# Patient Record
Sex: Male | Born: 1960 | Race: White | Hispanic: No | Marital: Married | Smoking: Current every day smoker
Health system: Southern US, Community
[De-identification: ages and names within clinical notes are randomized; demographics above are authoritative.]

## PROBLEM LIST (undated history)

## (undated) ENCOUNTER — Emergency Department (HOSPITAL_COMMUNITY): Admission: EM | Payer: 59

## (undated) DIAGNOSIS — G40909 Epilepsy, unspecified, not intractable, without status epilepticus: Secondary | ICD-10-CM

## (undated) DIAGNOSIS — E1140 Type 2 diabetes mellitus with diabetic neuropathy, unspecified: Secondary | ICD-10-CM

## (undated) DIAGNOSIS — N183 Chronic kidney disease, stage 3 unspecified: Secondary | ICD-10-CM

## (undated) DIAGNOSIS — L03119 Cellulitis of unspecified part of limb: Secondary | ICD-10-CM

## (undated) DIAGNOSIS — R569 Unspecified convulsions: Secondary | ICD-10-CM

## (undated) DIAGNOSIS — I10 Essential (primary) hypertension: Secondary | ICD-10-CM

## (undated) DIAGNOSIS — L02619 Cutaneous abscess of unspecified foot: Secondary | ICD-10-CM

## (undated) HISTORY — PX: BRAIN SURGERY: SHX531

## (undated) HISTORY — PX: HERNIA REPAIR: SHX51

## (undated) HISTORY — PX: TOE AMPUTATION: SHX809

---

## 2014-03-24 ENCOUNTER — Ambulatory Visit
Admission: RE | Admit: 2014-03-24 | Discharge: 2014-03-24 | Disposition: A | Discharge status: Home / Self Care | Payer: No Typology Code available for payment source | Source: Ambulatory Visit | Attending: *Deleted | Admitting: *Deleted

## 2014-03-24 ENCOUNTER — Other Ambulatory Visit: Payer: Self-pay | Admitting: *Deleted

## 2014-03-24 DIAGNOSIS — R7611 Nonspecific reaction to tuberculin skin test without active tuberculosis: Secondary | ICD-10-CM

## 2014-04-30 ENCOUNTER — Inpatient Hospital Stay (HOSPITAL_COMMUNITY): Payer: 59

## 2014-04-30 ENCOUNTER — Emergency Department (HOSPITAL_COMMUNITY): Payer: 59

## 2014-04-30 ENCOUNTER — Encounter (HOSPITAL_COMMUNITY): Payer: Self-pay | Admitting: Emergency Medicine

## 2014-04-30 ENCOUNTER — Inpatient Hospital Stay (HOSPITAL_COMMUNITY)
Admission: EM | Admit: 2014-04-30 | Discharge: 2014-05-02 | DRG: 572 | Disposition: A | Discharge status: Home / Self Care | Payer: 59 | Attending: Internal Medicine | Admitting: Internal Medicine

## 2014-04-30 DIAGNOSIS — I10 Essential (primary) hypertension: Secondary | ICD-10-CM

## 2014-04-30 DIAGNOSIS — I739 Peripheral vascular disease, unspecified: Secondary | ICD-10-CM | POA: Diagnosis present

## 2014-04-30 DIAGNOSIS — E1159 Type 2 diabetes mellitus with other circulatory complications: Secondary | ICD-10-CM | POA: Diagnosis present

## 2014-04-30 DIAGNOSIS — L97519 Non-pressure chronic ulcer of other part of right foot with unspecified severity: Secondary | ICD-10-CM

## 2014-04-30 DIAGNOSIS — E1329 Other specified diabetes mellitus with other diabetic kidney complication: Secondary | ICD-10-CM

## 2014-04-30 DIAGNOSIS — N189 Chronic kidney disease, unspecified: Secondary | ICD-10-CM

## 2014-04-30 DIAGNOSIS — Z794 Long term (current) use of insulin: Secondary | ICD-10-CM | POA: Diagnosis not present

## 2014-04-30 DIAGNOSIS — I129 Hypertensive chronic kidney disease with stage 1 through stage 4 chronic kidney disease, or unspecified chronic kidney disease: Secondary | ICD-10-CM | POA: Diagnosis present

## 2014-04-30 DIAGNOSIS — I798 Other disorders of arteries, arterioles and capillaries in diseases classified elsewhere: Secondary | ICD-10-CM | POA: Diagnosis present

## 2014-04-30 DIAGNOSIS — L03115 Cellulitis of right lower limb: Secondary | ICD-10-CM

## 2014-04-30 DIAGNOSIS — F1010 Alcohol abuse, uncomplicated: Secondary | ICD-10-CM | POA: Diagnosis present

## 2014-04-30 DIAGNOSIS — E1169 Type 2 diabetes mellitus with other specified complication: Secondary | ICD-10-CM | POA: Diagnosis present

## 2014-04-30 DIAGNOSIS — N058 Unspecified nephritic syndrome with other morphologic changes: Secondary | ICD-10-CM | POA: Diagnosis present

## 2014-04-30 DIAGNOSIS — E1142 Type 2 diabetes mellitus with diabetic polyneuropathy: Secondary | ICD-10-CM | POA: Diagnosis present

## 2014-04-30 DIAGNOSIS — Z9641 Presence of insulin pump (external) (internal): Secondary | ICD-10-CM | POA: Diagnosis not present

## 2014-04-30 DIAGNOSIS — S98119A Complete traumatic amputation of unspecified great toe, initial encounter: Secondary | ICD-10-CM

## 2014-04-30 DIAGNOSIS — L089 Local infection of the skin and subcutaneous tissue, unspecified: Secondary | ICD-10-CM

## 2014-04-30 DIAGNOSIS — N183 Chronic kidney disease, stage 3 unspecified: Secondary | ICD-10-CM

## 2014-04-30 DIAGNOSIS — L97529 Non-pressure chronic ulcer of other part of left foot with unspecified severity: Secondary | ICD-10-CM

## 2014-04-30 DIAGNOSIS — E1129 Type 2 diabetes mellitus with other diabetic kidney complication: Secondary | ICD-10-CM | POA: Diagnosis present

## 2014-04-30 DIAGNOSIS — T798XXA Other early complications of trauma, initial encounter: Secondary | ICD-10-CM

## 2014-04-30 DIAGNOSIS — E11621 Type 2 diabetes mellitus with foot ulcer: Secondary | ICD-10-CM | POA: Diagnosis present

## 2014-04-30 DIAGNOSIS — E875 Hyperkalemia: Secondary | ICD-10-CM | POA: Diagnosis present

## 2014-04-30 DIAGNOSIS — E1149 Type 2 diabetes mellitus with other diabetic neurological complication: Secondary | ICD-10-CM | POA: Diagnosis present

## 2014-04-30 DIAGNOSIS — G40909 Epilepsy, unspecified, not intractable, without status epilepticus: Secondary | ICD-10-CM

## 2014-04-30 DIAGNOSIS — M7989 Other specified soft tissue disorders: Secondary | ICD-10-CM | POA: Diagnosis present

## 2014-04-30 DIAGNOSIS — L02619 Cutaneous abscess of unspecified foot: Principal | ICD-10-CM

## 2014-04-30 DIAGNOSIS — D649 Anemia, unspecified: Secondary | ICD-10-CM

## 2014-04-30 DIAGNOSIS — S98139A Complete traumatic amputation of one unspecified lesser toe, initial encounter: Secondary | ICD-10-CM | POA: Diagnosis not present

## 2014-04-30 DIAGNOSIS — L97509 Non-pressure chronic ulcer of other part of unspecified foot with unspecified severity: Secondary | ICD-10-CM | POA: Diagnosis present

## 2014-04-30 DIAGNOSIS — T148XXA Other injury of unspecified body region, initial encounter: Secondary | ICD-10-CM

## 2014-04-30 DIAGNOSIS — M795 Residual foreign body in soft tissue: Secondary | ICD-10-CM

## 2014-04-30 DIAGNOSIS — Z720 Tobacco use: Secondary | ICD-10-CM | POA: Diagnosis present

## 2014-04-30 DIAGNOSIS — L03119 Cellulitis of unspecified part of limb: Principal | ICD-10-CM

## 2014-04-30 DIAGNOSIS — E1340 Other specified diabetes mellitus with diabetic neuropathy, unspecified: Secondary | ICD-10-CM

## 2014-04-30 DIAGNOSIS — E0822 Diabetes mellitus due to underlying condition with diabetic chronic kidney disease: Secondary | ICD-10-CM

## 2014-04-30 DIAGNOSIS — E1140 Type 2 diabetes mellitus with diabetic neuropathy, unspecified: Secondary | ICD-10-CM | POA: Diagnosis present

## 2014-04-30 DIAGNOSIS — F17200 Nicotine dependence, unspecified, uncomplicated: Secondary | ICD-10-CM | POA: Diagnosis present

## 2014-04-30 HISTORY — DX: Essential (primary) hypertension: I10

## 2014-04-30 HISTORY — DX: Epilepsy, unspecified, not intractable, without status epilepticus: G40.909

## 2014-04-30 HISTORY — DX: Chronic kidney disease, stage 3 unspecified: N18.30

## 2014-04-30 HISTORY — DX: Chronic kidney disease, stage 3 (moderate): N18.3

## 2014-04-30 HISTORY — DX: Type 2 diabetes mellitus with diabetic neuropathy, unspecified: E11.40

## 2014-04-30 HISTORY — DX: Cellulitis of unspecified part of limb: L03.119

## 2014-04-30 HISTORY — DX: Cutaneous abscess of unspecified foot: L02.619

## 2014-04-30 LAB — GLUCOSE, CAPILLARY
Glucose-Capillary: 221 mg/dL — ABNORMAL HIGH (ref 70–99)
Glucose-Capillary: 361 mg/dL — ABNORMAL HIGH (ref 70–99)

## 2014-04-30 LAB — BASIC METABOLIC PANEL
Anion gap: 14 (ref 5–15)
BUN: 36 mg/dL — AB (ref 6–23)
CHLORIDE: 99 meq/L (ref 96–112)
CO2: 23 mEq/L (ref 19–32)
CREATININE: 2.03 mg/dL — AB (ref 0.50–1.35)
Calcium: 9.4 mg/dL (ref 8.4–10.5)
GFR calc Af Amer: 42 mL/min — ABNORMAL LOW (ref >90)
GFR calc non Af Amer: 36 mL/min — ABNORMAL LOW (ref >90)
GLUCOSE: 213 mg/dL — AB (ref 70–99)
Potassium: 4.9 mEq/L (ref 3.7–5.3)
Sodium: 136 mEq/L — ABNORMAL LOW (ref 137–147)

## 2014-04-30 LAB — CBC
HEMATOCRIT: 36.4 % — AB (ref 39.0–52.0)
Hemoglobin: 12.6 g/dL — ABNORMAL LOW (ref 13.0–17.0)
MCH: 31.3 pg (ref 26.0–34.0)
MCHC: 34.6 g/dL (ref 30.0–36.0)
MCV: 90.3 fL (ref 78.0–100.0)
Platelets: 207 10*3/uL (ref 150–400)
RBC: 4.03 MIL/uL — ABNORMAL LOW (ref 4.22–5.81)
RDW: 12.7 % (ref 11.5–15.5)
WBC: 10.2 10*3/uL (ref 4.0–10.5)

## 2014-04-30 MED ORDER — VANCOMYCIN HCL IN DEXTROSE 750-5 MG/150ML-% IV SOLN
750.0000 mg | Freq: Two times a day (BID) | INTRAVENOUS | Status: DC
  Administered 2014-05-01 – 2014-05-02 (×3): 750 mg via INTRAVENOUS
  Filled 2014-04-30 (×7): qty 150

## 2014-04-30 MED ORDER — ONDANSETRON HCL 4 MG/2ML IJ SOLN: 4.0000 mg | Freq: Four times a day (QID) | INTRAMUSCULAR | Status: DC | PRN

## 2014-04-30 MED ORDER — HEPARIN SODIUM (PORCINE) 5000 UNIT/ML IJ SOLN
5000.0000 [IU] | Freq: Three times a day (TID) | INTRAMUSCULAR | Status: DC
  Administered 2014-04-30: 5000 [IU] via SUBCUTANEOUS
  Filled 2014-04-30 (×3): qty 1

## 2014-04-30 MED ORDER — ONDANSETRON HCL 4 MG PO TABS: 4.0000 mg | ORAL_TABLET | Freq: Four times a day (QID) | ORAL | Status: DC | PRN

## 2014-04-30 MED ORDER — INSULIN ASPART 100 UNIT/ML ~~LOC~~ SOLN
0.0000 [IU] | Freq: Three times a day (TID) | SUBCUTANEOUS | Status: DC
  Administered 2014-04-30: 3 [IU] via SUBCUTANEOUS
  Administered 2014-05-01: 5 [IU] via SUBCUTANEOUS

## 2014-04-30 MED ORDER — MAGNESIUM OXIDE 400 (241.3 MG) MG PO TABS
400.0000 mg | ORAL_TABLET | Freq: Every day | ORAL | Status: DC
  Administered 2014-05-01 – 2014-05-02 (×2): 400 mg via ORAL
  Filled 2014-04-30 (×2): qty 1

## 2014-04-30 MED ORDER — PIPERACILLIN-TAZOBACTAM 3.375 G IVPB
3.3750 g | Freq: Three times a day (TID) | INTRAVENOUS | Status: DC
  Administered 2014-05-01 – 2014-05-02 (×5): 3.375 g via INTRAVENOUS
  Filled 2014-04-30 (×11): qty 50

## 2014-04-30 MED ORDER — VITAMIN D (ERGOCALCIFEROL) 1.25 MG (50000 UNIT) PO CAPS: 50000.0000 [IU] | ORAL_CAPSULE | ORAL | Status: DC

## 2014-04-30 MED ORDER — PIPERACILLIN-TAZOBACTAM 3.375 G IVPB
3.3750 g | Freq: Once | INTRAVENOUS | Status: AC
  Administered 2014-04-30: 3.375 g via INTRAVENOUS
  Filled 2014-04-30: qty 50

## 2014-04-30 MED ORDER — INSULIN ASPART 100 UNIT/ML ~~LOC~~ SOLN
0.0000 [IU] | Freq: Every day | SUBCUTANEOUS | Status: DC
  Administered 2014-04-30: 0 [IU] via SUBCUTANEOUS

## 2014-04-30 MED ORDER — ADULT MULTIVITAMIN W/MINERALS CH
1.0000 | ORAL_TABLET | Freq: Every day | ORAL | Status: DC
  Administered 2014-05-01 – 2014-05-02 (×2): 1 via ORAL
  Filled 2014-04-30 (×2): qty 1

## 2014-04-30 MED ORDER — ASPIRIN EC 81 MG PO TBEC
81.0000 mg | DELAYED_RELEASE_TABLET | Freq: Every day | ORAL | Status: DC
  Administered 2014-05-01 – 2014-05-02 (×2): 81 mg via ORAL
  Filled 2014-04-30 (×2): qty 1

## 2014-04-30 MED ORDER — MAGNESIUM OXIDE 400 MG PO TABS
400.0000 mg | ORAL_TABLET | Freq: Every day | ORAL | Status: DC
  Filled 2014-04-30: qty 1

## 2014-04-30 MED ORDER — SODIUM CHLORIDE 0.9 % IJ SOLN
3.0000 mL | Freq: Two times a day (BID) | INTRAMUSCULAR | Status: DC
  Administered 2014-05-01 – 2014-05-02 (×2): 3 mL via INTRAVENOUS

## 2014-04-30 MED ORDER — OMEPRAZOLE MAGNESIUM 20 MG PO TBEC: 20.0000 mg | DELAYED_RELEASE_TABLET | Freq: Every day | ORAL | Status: DC

## 2014-04-30 MED ORDER — VANCOMYCIN HCL IN DEXTROSE 1-5 GM/200ML-% IV SOLN
1000.0000 mg | Freq: Once | INTRAVENOUS | Status: AC
  Administered 2014-04-30: 1000 mg via INTRAVENOUS
  Filled 2014-04-30: qty 200

## 2014-04-30 MED ORDER — PANTOPRAZOLE SODIUM 40 MG PO TBEC
40.0000 mg | DELAYED_RELEASE_TABLET | Freq: Every day | ORAL | Status: DC
  Administered 2014-05-01 – 2014-05-02 (×2): 40 mg via ORAL
  Filled 2014-04-30 (×2): qty 1

## 2014-04-30 MED ORDER — LEVETIRACETAM 500 MG PO TABS
1000.0000 mg | ORAL_TABLET | Freq: Two times a day (BID) | ORAL | Status: DC
  Administered 2014-04-30 – 2014-05-02 (×4): 1000 mg via ORAL
  Filled 2014-04-30 (×4): qty 2

## 2014-04-30 MED ORDER — HYDROCHLOROTHIAZIDE 12.5 MG PO CAPS
12.5000 mg | ORAL_CAPSULE | Freq: Every day | ORAL | Status: DC
  Administered 2014-05-01: 12.5 mg via ORAL
  Filled 2014-04-30: qty 1

## 2014-04-30 MED ORDER — LISINOPRIL 10 MG PO TABS
40.0000 mg | ORAL_TABLET | Freq: Every day | ORAL | Status: DC
  Administered 2014-05-01: 40 mg via ORAL
  Filled 2014-04-30 (×2): qty 4

## 2014-04-30 MED ORDER — CLONIDINE HCL 0.1 MG PO TABS
0.1000 mg | ORAL_TABLET | Freq: Three times a day (TID) | ORAL | Status: DC
  Administered 2014-04-30 – 2014-05-02 (×4): 0.1 mg via ORAL
  Filled 2014-04-30 (×6): qty 1

## 2014-04-30 MED ORDER — CARVEDILOL 12.5 MG PO TABS
25.0000 mg | ORAL_TABLET | Freq: Two times a day (BID) | ORAL | Status: DC
  Administered 2014-04-30 – 2014-05-02 (×3): 25 mg via ORAL
  Filled 2014-04-30 (×4): qty 2

## 2014-04-30 MED ORDER — VANCOMYCIN HCL IN DEXTROSE 1-5 GM/200ML-% IV SOLN
1000.0000 mg | Freq: Once | INTRAVENOUS | Status: DC
  Filled 2014-04-30: qty 200

## 2014-04-30 MED ORDER — SERTRALINE HCL 50 MG PO TABS
50.0000 mg | ORAL_TABLET | Freq: Every day | ORAL | Status: DC
  Administered 2014-05-01 – 2014-05-02 (×2): 50 mg via ORAL
  Filled 2014-04-30 (×2): qty 1

## 2014-04-30 MED ORDER — HYDRALAZINE HCL 25 MG PO TABS
50.0000 mg | ORAL_TABLET | Freq: Three times a day (TID) | ORAL | Status: DC
  Administered 2014-04-30 – 2014-05-02 (×4): 50 mg via ORAL
  Filled 2014-04-30 (×6): qty 2

## 2014-04-30 MED ORDER — INSULIN PUMP
SUBCUTANEOUS | Status: DC
  Administered 2014-04-30: 2.6 via SUBCUTANEOUS
  Filled 2014-04-30: qty 1

## 2014-04-30 NOTE — Progress Notes (Signed)
ANTIBIOTIC CONSULT NOTE - INITIAL  Pharmacy Consult for Vancomycin & Zosyn Indication: cellultis, r/o osteomyelitis  No Known Allergies  Patient Measurements: Height: 6\' 1"  (185.4 cm) Weight: 162 lb (73.483 kg) IBW/kg (Calculated) : 79.9  Vital Signs: Temp: 98.4 F (36.9 C) (07/23 1638) Temp src: Oral (07/23 1638) BP: 144/79 mmHg (07/23 1638) Pulse Rate: 71 (07/23 1638) Intake/Output from previous day:   Intake/Output from this shift:    Labs:  Recent Labs  04/30/14 1555  WBC 10.2  HGB 12.6*  PLT 207  CREATININE 2.03*   Estimated Creatinine Clearance: 44.3 ml/min (by C-G formula based on Cr of 2.03). No results found for this basename: VANCOTROUGH, VANCOPEAK, VANCORANDOM, GENTTROUGH, GENTPEAK, GENTRANDOM, TOBRATROUGH, TOBRAPEAK, TOBRARND, AMIKACINPEAK, AMIKACINTROU, AMIKACIN,  in the last 72 hours   Microbiology: No results found for this or any previous visit (from the past 720 hour(s)).  Medical History: Past Medical History  Diagnosis Date  . Diabetes mellitus without complication   . Hypertension   . Renal disorder     Medications:  Scheduled:  . [START ON 05/01/2014] aspirin EC  81 mg Oral Daily  . carvedilol  25 mg Oral BID  . cloNIDine  0.1 mg Oral TID  . heparin  5,000 Units Subcutaneous 3 times per day  . hydrALAZINE  50 mg Oral TID  . [START ON 05/01/2014] hydrochlorothiazide  12.5 mg Oral Daily  . insulin aspart  0-5 Units Subcutaneous QHS  . insulin aspart  0-9 Units Subcutaneous TID WC  . levETIRAcetam  1,000 mg Oral BID  . lisinopril  40 mg Oral Daily  . [START ON 05/01/2014] magnesium oxide  400 mg Oral Daily  . multivitamin with minerals  1 tablet Oral Daily  . [START ON 05/01/2014] pantoprazole  40 mg Oral Daily  . piperacillin-tazobactam (ZOSYN)  IV  3.375 g Intravenous Once  . piperacillin-tazobactam (ZOSYN)  IV  3.375 g Intravenous Q8H  . sertraline  50 mg Oral Daily  . sodium chloride  3 mL Intravenous Q12H  . vancomycin  1,000 mg  Intravenous Once  . [START ON 05/01/2014] vancomycin  750 mg Intravenous Q12H  . [START ON 05/23/2014] Vitamin D (Ergocalciferol)  50,000 Units Oral Q30 days   Assessment: 53 yo diabetic M with ulceration of right foot.  He is admitted for IV antibiotics & possible debridement.  Xray of foot did not show osteomyelitis, MRI pending.  He is currently afebrile with normal WBC. He has Stage III CKD.  Estimated CrCl ~40-7545ml/min. Initial doses given in ED.  Vancomycin 7/23>> Zosyn 7/23>>  Goal of Therapy:  Vancomycin trough level 15-20 mcg/ml until osteo ruled out  Plan:  Zosyn 3.375gm IV Q8h to be infused over 4hrs Vancomycin 750mg  IV q12h Check Vancomycin trough at steady state Monitor renal function and cx data   Elson ClanLilliston, Koua Deeg Michelle 04/30/2014,4:56 PM

## 2014-04-30 NOTE — Progress Notes (Signed)
Podiatry Progress Note  MRI reviewed.  Negative for osteomyelitis.  Assessment: 1.  Cellulitis, right foot 2.  Diabetic foot ulceration, right foot 3.  Diabetic foot ulceration, left foot 4.  Diabetes mellitus with peripheral neuropathy 5.  Renal disease (stage 3 per patient - labs pending) 6.  Hypertension 7.  History of alcohol abuse  Plan: No surgery planned.  I will debride ulcerations at bedside tomorrow.  A culture of the wound will be performed.  I will transition him to an Aquacel dressing.  Cotinue antibiotics.  Emery Binz IVAN 04/30/2014, 11:19 PM

## 2014-04-30 NOTE — Progress Notes (Signed)
Contacted midlevel provider to clarify insulin orders. Patient has insulin pump as well as sliding scale orders. Patient has a CBG of 361 for which he self administered 5 units via insulin pump. Contacted midlevel to find out if we should still give sliding scale as ordered. Was advised to just allow patient to manage insulin via his insulin pump like he always does. No additional insulin given. Recorded insulin in the MAR.

## 2014-04-30 NOTE — Consult Note (Addendum)
Podiatry Consult Note  Reason for Consultation:  Infected diabetic ulceration of the right foot  History of Present Illness: William Fuentes is a 53 y.o. male presents as a referral from Dr. Alona Bene in [place], [place].  The patient was evaluated this morning and diagnosed with an infected diabetic ulceration of his right foot.  The patient relates that the ulceration has been present "for weeks".  He was recently a resident at [facility] in [place] for rehabilitation of alcohol abuse.  He was evaluated at a wound care center while he was a resident.    Dr. Alona Bene has followed him for years and has performed multiple digital / ray amputations.  He felt that the patient would benefit from admission, IV antibiotics and surgical debridement.  He was referred to the emergency department at [facility].  The patient denies any nausea, vomiting, fever, chills, shortness of breath, calf tenderness or chest pain.  The patient relates history of stage 3 renal disease and is followed in [place] by Dr. Felipa Furnace.  He was last seen about 6-7 months ago.  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  He is followed by Dr. Elwyn Reach at [facility].  He was last seen about 1 year ago.  He does not wear diabetic shoes because he does not like the way they look.  His wife is present for today's visit.  Past Medical History  Diagnosis Date  . Diabetes mellitus without complication   . Hypertension   . Renal disorder    Scheduled Meds: Continuous Infusions: PRN Meds:.    No Known Allergies Past Surgical History  Procedure Laterality Date  . Brain surgery      2013  . 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  Marland Kitchen Hernia repair     History reviewed. No pertinent family history. Social History:  reports that he has been smoking.  He does not have any smokeless tobacco history on file. He reports that he does not drink alcohol. His drug history is not on file.  Review of  Systems: Negative for nausea, vomiting, fever, chills, chest pain, calf tenderness, shortness of breath, foot pain. Significant for neuropathy of both feet and ulcerations of both feet.  Physical Examination: Vital signs in last 24 hours:   Temp:  [98 F (36.7 C)] 98 F (36.7 C) (07/23 1250) Pulse Rate:  [70] 70 (07/23 1250) Resp:  [18] 18 (07/23 1250) BP: (137)/(74) 137/74 mmHg (07/23 1250) SpO2:  [97 %] 97 % (07/23 1250) Weight:  [161 lb (73.029 kg)] 161 lb (73.029 kg) (07/23 1250)  A dressing is in place on the right foot.  He has a sock on his left foot but no dressing.  Skin of both feet is warm and dry.  There is increased skin temperature along the dorsal lateral aspect of the right foot compared to his left foot.  There is a full-thickness ulceration present along the plantar aspect of the first metatarsal head of the left foot.  The ulceration measures 1.0 cm in length by 1.1 cm in width by 0.2 cm in depth.  The wound bed is granular with surrounding hyperkeratotic tissue present.  There are no acute signs of infection present surrounding this ulceration.  There is a full-thickness ulceration present along the plantar aspect of the third metatarsal head of the left foot.  The ulceration measures 0.8 cm in length by 0.5 cm in length by 0.2 cm in  depth.  The wound bed is granular with surrounding hyperkeratotic tissue present.  There no acute signs of infection present surrounding the ulceration.  There is a full-thickness ulceration present along the plantar aspect of the fifth metatarsal head of the right foot.  The ulceration measures 1.6 cm in length by 2.0 cm in length by 0.3 cm in depth.  The wound bed is comprised of fibrotic and granular tissue.  The ulceration does not probe to bone.  There is surrounding hyperkeratotic tissue present.  There is periwound erythema present extending to the dorsum of the fourth interspace.  No fluctuance was appreciated.  Erythema extends along the  dorsolateral aspect of the right foot.  Dorsalis pedis pulses palpable bilaterally.  Posterior tibial pulses palpable bilaterally.  There is edema of the right foot.  Amputation of the right hallux has been performed.  A partial second ray amputation of the right foot has been performed.  A partial third ray amputation of the right foot has been performed.  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  The right hallux is surgically absent.  Calves are soft and nontender bilaterally.  Protective sensation is absent.  Lab/Test Results:  No results found for this basename: WBC, HGB, HCT, PLT, NA, K, CL, CO2, BUN, CREATININE, GLUCOSE, CALCIUM,  in the last 72 hours  No results found for this or any previous visit (from the past 240 hour(s)).   Dg Foot Complete Right  04/30/2014   CLINICAL DATA:  Pain and swelling.  EXAM: RIGHT FOOT COMPLETE - 3+ VIEW  COMPARISON:  None.  FINDINGS: Soft tissue swelling noted of the distal right foot. Patient has had prior right first, second, third digit amputations. No underlying bony erosions or acute bony abnormalities identified. No radiopaque foreign bodies. Vascular calcifications are present.  IMPRESSION: 1. Prominent soft tissue swelling noted over the distal right foot. Underlying soft tissue infection cannot be excluded. 2. No acute bony abnormalities identified. Patient has had prior first through third digit amputation. No evidence of osteomyelitis. 3. Peripheral vascular disease.   Electronically Signed   By: Maisie Fushomas  Register   On: 04/30/2014 13:44    Assessment: 1.  Cellulitis of the right foot with concern for underlying osteomyelitis 2.  Diabetic foot ulceration, right foot 3.  Diabetic foot ulceration, left foot 4.  Diabetes mellitus with peripheral neuropathy 5.  Renal disease (stage 3 per patient - labs pending) 6.  Hypertension 7.  History of alcohol abuse  Plan: I discussed the patient's case with Dr.  Alona BeneJoyce.  Given the patient's history and clinical presentation, I am concerned with possible underlying osteomyelitis of his right foot.  I recommend an MRI to evaluate for possible osteomyelitis of the right foot.  I recommend admission for further evaluation, intravenous antibiotics and possible surgical intervention.  I explained to the patient that he is at risk for a transmetatarsal amputation of his right foot.  I discussed my findings and recommendations with Dr. Jeraldine LootsLockwood.  I recommend vancomycin and Zosyn.  I will see him first thing in the morning.  He has been placed NPO after midnight until I have had an opportunity to evaluate his MRI.  This has been explained to the patient.  Suzetta Timko IVAN 04/30/2014, 2:41 PM

## 2014-04-30 NOTE — ED Provider Notes (Signed)
CSN: [id number]     Arrival date & time 04/30/14  1242 History   First MD Initiated Contact with Patient 04/30/14 1245     Chief Complaint  Patient presents with  . Foot Injury     (Consider location/radiation/quality/duration/timing/severity/associated sxs/prior Treatment) HPI Patient presents with concern of a nonhealing lesion on his right foot. Patient has had a small ulcer in the area, but 2 days ago developed increasing erythema, widening of the wound Patient has neuropathy, and is essentially desensate on the foot. He denies any fevers, chills, nausea, vomiting. He has previous amputation of multiple digits on the affected foot. He was sent here by primary care for evaluation.  Past Medical History  Diagnosis Date  . Diabetes mellitus without complication   . Hypertension   . Renal disorder    Past Surgical History  Procedure Laterality Date  . Brain surgery      2013  . 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  Marland Kitchen Hernia repair     History reviewed. No pertinent family history. History  Substance Use Topics  . Smoking status: Current Every Day Smoker -- 1.00 packs/day  . Smokeless tobacco: Not on file  . Alcohol Use: No     Comment: last use 90 days ago.    Review of Systems  Constitutional:       Per HPI, otherwise negative  HENT:       Per HPI, otherwise negative  Respiratory:       Per HPI, otherwise negative  Cardiovascular:       Per HPI, otherwise negative  Gastrointestinal: Negative for vomiting.  Endocrine:       Negative aside from HPI  Genitourinary:       Neg aside from HPI   Musculoskeletal:       Per HPI, otherwise negative  Skin: Negative.   Neurological: Negative for syncope.      Allergies  Review of patient's allergies indicates no known allergies.  Home Medications   Prior to Admission medications   Medication Sig Start Date End Date Taking? Authorizing Provider  aspirin EC 81 MG tablet Take 81 mg by mouth  daily.   Yes Historical Provider, MD  carvedilol (COREG) 25 MG tablet Take 1 tablet by mouth 2 (two) times daily. 04/25/14  Yes Historical Provider, MD  cloNIDine (CATAPRES) 0.1 MG tablet Take 1 tablet by mouth 3 (three) times daily. 02/22/14  Yes Historical Provider, MD  hydrALAZINE (APRESOLINE) 50 MG tablet Take 1 tablet by mouth 3 (three) times daily. 04/24/14  Yes Historical Provider, MD  hydrochlorothiazide (MICROZIDE) 12.5 MG capsule Take 1 capsule by mouth daily. 04/24/14  Yes Historical Provider, MD  Insulin Human (INSULIN PUMP) SOLN Inject into the skin every 4 (four) hours. HUMALOG INSULIN   Yes Historical Provider, MD  levETIRAcetam (KEPPRA) 1000 MG tablet Take 1 tablet by mouth 2 (two) times daily. 04/27/14  Yes Historical Provider, MD  lisinopril (PRINIVIL,ZESTRIL) 40 MG tablet Take 40 mg by mouth daily.   Yes Historical Provider, MD  magnesium oxide (MAG-OX) 400 MG tablet Take 400 mg by mouth daily.   Yes Historical Provider, MD  Multiple Vitamin (MULTIVITAMIN WITH MINERALS) TABS tablet Take 1 tablet by mouth daily.   Yes Historical Provider, MD  omeprazole (PRILOSEC OTC) 20 MG tablet Take 20 mg by mouth daily.   Yes Historical Provider, MD  sertraline (ZOLOFT) 50 MG tablet Take 1 tablet by mouth daily. 04/18/14  Yes Historical Provider,  MD  Vitamin D, Ergocalciferol, (DRISDOL) 50000 UNITS CAPS capsule Take 1 capsule by mouth every 30 (thirty) days.  04/18/14  Yes Historical Provider, MD   BP 137/74  Pulse 70  Temp(Src) 98 F (36.7 C) (Oral)  Resp 18  Ht 6\' 1"  (1.854 m)  Wt 161 lb (73.029 kg)  BMI 21.25 kg/m2  SpO2 97% Physical Exam  Nursing note and vitals reviewed. Constitutional: He is oriented to person, place, and time. He appears well-developed. No distress.  HENT:  Head: Normocephalic and atraumatic.  Eyes: Conjunctivae and EOM are normal.  Cardiovascular: Normal rate and regular rhythm.   Pulmonary/Chest: Effort normal. No stridor. No respiratory distress.  Abdominal: He  exhibits no distension.  Musculoskeletal: He exhibits no edema.       Feet:  Neurological: He is alert and oriented to person, place, and time.  Skin: Skin is warm and dry.  Psychiatric: He has a normal mood and affect.    ED Course  Procedures (including critical care time)  X-ray does not demonstrate obvious osteomyelitis   After the initial evaluation I reviewed the patient's x-ray images, discussed his case with our podiatrist on call.    MDM   Patient presents with nonhealing wound, some concern for infection.  Given the patient's history of diabetes, and multiple prior amputations, I discussed his case with our podiatrist who has seen and evaluated the patient as well. Patient will be admitted for further wound care, consideration of additional evaluation for subtle osteomyelitis.  Patient started on broad spectrum antibiotics and emergency department, per the request of our podiatrist.   Gerhard Munchobert Sabian Kuba, MD 04/30/14 1524

## 2014-04-30 NOTE — H&P (Signed)
[facility] History and Physical  Jasean Ambrosia ZOX:[id number] DOB: November 04, 1960 DOA: 04/30/2014  Referring physician: Dr. Jeraldine Loots, ER. PCP: No primary provider on file.   Chief Complaint: Right foot redness/swelling.  HPI: William Fuentes is a 53 y.o. male  This is a 53 year old man, diabetic, off 32 years on insulin pump, who presents with redness and swelling of the right foot, dorsal aspect. He had ulceration on the plantar aspect for several weeks and his primary care physician in [place], [place] sent him to the hospital here for further management. He has had multiple digital/ray amputations of the right foot. He denies any fever. He also has chronic kidney disease stage III and is followed by a nephrologist in [place].   Review of Systems:  Constitutional:  No weight loss, night sweats,chills, fatigue.  HEENT:  No headaches, Difficulty swallowing,Tooth/dental problems,Sore throat,  No sneezing, itching, ear ache, nasal congestion, post nasal drip,  Cardio-vascular:  No chest pain, Orthopnea, PND, swelling in lower extremities, anasarca, dizziness, palpitations  GI:  No heartburn, indigestion, abdominal pain, nausea, vomiting, diarrhea, change in bowel habits, loss of appetite  Resp:  No shortness of breath with exertion or at rest. No excess mucus, no productive cough, No non-productive cough, No coughing up of blood.No change in color of mucus.No wheezing.No chest wall deformity   GU:  no dysuria, change in color of urine, no urgency or frequency. No flank pain.   Psych:  No change in mood or affect. No depression or anxiety. No memory loss.   Past Medical History  Diagnosis Date  . Diabetes mellitus without complication   . Hypertension   . Renal disorder    Past Surgical History  Procedure Laterality Date  . Brain surgery      2013  . 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  Marland Kitchen Hernia repair     Social History:  reports that he has been smoking.   He does not have any smokeless tobacco history on file. He reports that he does not drink alcohol. His drug history is not on file.  No Known Allergies  History reviewed. No pertinent family history.   Prior to Admission medications   Medication Sig Start Date End Date Taking? Authorizing Provider  aspirin EC 81 MG tablet Take 81 mg by mouth daily.   Yes Historical Provider, MD  carvedilol (COREG) 25 MG tablet Take 1 tablet by mouth 2 (two) times daily. 04/25/14  Yes Historical Provider, MD  cloNIDine (CATAPRES) 0.1 MG tablet Take 1 tablet by mouth 3 (three) times daily. 02/22/14  Yes Historical Provider, MD  hydrALAZINE (APRESOLINE) 50 MG tablet Take 1 tablet by mouth 3 (three) times daily. 04/24/14  Yes Historical Provider, MD  hydrochlorothiazide (MICROZIDE) 12.5 MG capsule Take 1 capsule by mouth daily. 04/24/14  Yes Historical Provider, MD  Insulin Human (INSULIN PUMP) SOLN Inject into the skin continuous. HUMALOG 100 unit/mL INSULIN   Yes Historical Provider, MD  levETIRAcetam (KEPPRA) 1000 MG tablet Take 1 tablet by mouth 2 (two) times daily. 04/27/14  Yes Historical Provider, MD  lisinopril (PRINIVIL,ZESTRIL) 40 MG tablet Take 40 mg by mouth daily.   Yes Historical Provider, MD  magnesium oxide (MAG-OX) 400 MG tablet Take 400 mg by mouth daily.   Yes Historical Provider, MD  Multiple Vitamin (MULTIVITAMIN WITH MINERALS) TABS tablet Take 1 tablet by mouth daily.   Yes Historical Provider, MD  omeprazole (PRILOSEC OTC) 20 MG tablet Take 20 mg by mouth daily.  Yes Historical Provider, MD  sertraline (ZOLOFT) 50 MG tablet Take 1 tablet by mouth daily. 04/18/14  Yes Historical Provider, MD  Vitamin D, Ergocalciferol, (DRISDOL) 50000 UNITS CAPS capsule Take 1 capsule by mouth every 30 (thirty) days.  04/18/14  Yes Historical Provider, MD   Physical Exam: Filed Vitals:   04/30/14 1250  BP: 137/74  Pulse: 70  Temp: 98 F (36.7 C)  TempSrc: Oral  Resp: 18  Height: 6\' 1"  (1.854 m)  Weight:  73.029 kg (161 lb)  SpO2: 97%    Wt Readings from Last 3 Encounters:  04/30/14 73.029 kg (161 lb)    General:  Appears calm and comfortable. He does not appear septic or toxic. Eyes: PERRL, normal lids, irises & conjunctiva ENT: grossly normal hearing, lips & tongue Neck: no LAD, masses or thyromegaly Cardiovascular: RRR, no m/r/g. No LE edema. There is a left carotid bruit. Telemetry: SR, no arrhythmias  Respiratory: CTA bilaterally, no w/r/r. Normal respiratory effort. Abdomen: soft, ntnd Skin: He has cellulitis affecting most of the right foot mostly on the dorsal aspect. There is a ration of this foot on the plantar aspect approximately 1 cm in diameter. He also has ulcerations on the left foot on the plantar aspect but there is no obvious cellulitis with this. There is evidence of multiple digital amputation on the right foot. Musculoskeletal: grossly normal tone BUE/BLE Psychiatric: grossly normal mood and affect, speech fluent and appropriate Neurologic: grossly non-focal.             CBC:  Recent Labs Lab 04/30/14 1555  WBC 10.2  HGB 12.6*  HCT 36.4*  MCV 90.3  PLT 207   Cardiac Enzymes:   BNP (last 3 results)  CBG:   Radiological Exams on Admission: Dg Foot Complete Right  04/30/2014   CLINICAL DATA:  Pain and swelling.  EXAM: RIGHT FOOT COMPLETE - 3+ VIEW  COMPARISON:  None.  FINDINGS: Soft tissue swelling noted of the distal right foot. Patient has had prior right first, second, third digit amputations. No underlying bony erosions or acute bony abnormalities identified. No radiopaque foreign bodies. Vascular calcifications are present.  IMPRESSION: 1. Prominent soft tissue swelling noted over the distal right foot. Underlying soft tissue infection cannot be excluded. 2. No acute bony abnormalities identified. Patient has had prior first through third digit amputation. No evidence of osteomyelitis. 3. Peripheral vascular disease.   Electronically Signed   By:  Maisie Fushomas  Register   On: 04/30/2014 13:44      Assessment/Plan   1. Right foot cellulitis, concerning for underlying osteomyelitis. 2. 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 3. Diabetic neuropathy. 4. Hypertension. 5. Chronic kidney disease stage III.  Plan: 1. Admit to medical floor. 2. Intravenous antibiotics. 3. Monitor and control diabetes closely. 4. MRI of the right foot looking for osteomyelitis. 5. Podiatry consultation appreciated.  Further recommendations will depend on patient's hospital progress.   Code Status: Full code.   DVT Prophylaxis: Heparin.  Family Communication: I discussed the plan with patient at the bedside.   Disposition Plan: Home in medically stable.   Time spent: 60 minutes.  Wilson SingerGOSRANI,NIMISH C [facility] Pager [phone].  **Disclaimer: This note may have been dictated with voice recognition software. Similar sounding words can inadvertently be transcribed and this note may contain transcription errors which may not have been corrected upon publication of note.**

## 2014-04-30 NOTE — ED Notes (Signed)
Pt sent from Dr. Alona BeneJoyce office regarding right foot infection. Pt reports foot "ulceration" x2 months. Pt denies any fevers,n/v.

## 2014-05-01 ENCOUNTER — Encounter (HOSPITAL_COMMUNITY): Payer: Self-pay | Admitting: Internal Medicine

## 2014-05-01 DIAGNOSIS — E1142 Type 2 diabetes mellitus with diabetic polyneuropathy: Secondary | ICD-10-CM

## 2014-05-01 DIAGNOSIS — N183 Chronic kidney disease, stage 3 unspecified: Secondary | ICD-10-CM | POA: Diagnosis present

## 2014-05-01 DIAGNOSIS — G40909 Epilepsy, unspecified, not intractable, without status epilepticus: Secondary | ICD-10-CM | POA: Diagnosis present

## 2014-05-01 DIAGNOSIS — E11621 Type 2 diabetes mellitus with foot ulcer: Secondary | ICD-10-CM | POA: Diagnosis present

## 2014-05-01 DIAGNOSIS — E1140 Type 2 diabetes mellitus with diabetic neuropathy, unspecified: Secondary | ICD-10-CM | POA: Diagnosis present

## 2014-05-01 DIAGNOSIS — E1149 Type 2 diabetes mellitus with other diabetic neurological complication: Secondary | ICD-10-CM

## 2014-05-01 DIAGNOSIS — Z720 Tobacco use: Secondary | ICD-10-CM | POA: Diagnosis present

## 2014-05-01 DIAGNOSIS — D649 Anemia, unspecified: Secondary | ICD-10-CM | POA: Diagnosis present

## 2014-05-01 DIAGNOSIS — L97519 Non-pressure chronic ulcer of other part of right foot with unspecified severity: Secondary | ICD-10-CM

## 2014-05-01 DIAGNOSIS — L97529 Non-pressure chronic ulcer of other part of left foot with unspecified severity: Secondary | ICD-10-CM

## 2014-05-01 LAB — CBC
HCT: 34 % — ABNORMAL LOW (ref 39.0–52.0)
Hemoglobin: 11.4 g/dL — ABNORMAL LOW (ref 13.0–17.0)
MCH: 30.4 pg (ref 26.0–34.0)
MCHC: 33.5 g/dL (ref 30.0–36.0)
MCV: 90.7 fL (ref 78.0–100.0)
Platelets: 185 10*3/uL (ref 150–400)
RBC: 3.75 MIL/uL — ABNORMAL LOW (ref 4.22–5.81)
RDW: 12.7 % (ref 11.5–15.5)
WBC: 7.5 10*3/uL (ref 4.0–10.5)

## 2014-05-01 LAB — COMPREHENSIVE METABOLIC PANEL
ALBUMIN: 3.3 g/dL — AB (ref 3.5–5.2)
ALK PHOS: 62 U/L (ref 39–117)
ALT: 17 U/L (ref 0–53)
AST: 17 U/L (ref 0–37)
Anion gap: 11 (ref 5–15)
BILIRUBIN TOTAL: 0.3 mg/dL (ref 0.3–1.2)
BUN: 34 mg/dL — ABNORMAL HIGH (ref 6–23)
CO2: 25 mEq/L (ref 19–32)
Calcium: 9.1 mg/dL (ref 8.4–10.5)
Chloride: 102 mEq/L (ref 96–112)
Creatinine, Ser: 2.18 mg/dL — ABNORMAL HIGH (ref 0.50–1.35)
GFR calc Af Amer: 38 mL/min — ABNORMAL LOW (ref >90)
GFR calc non Af Amer: 33 mL/min — ABNORMAL LOW (ref >90)
Glucose, Bld: 249 mg/dL — ABNORMAL HIGH (ref 70–99)
Potassium: 5.5 mEq/L — ABNORMAL HIGH (ref 3.7–5.3)
SODIUM: 138 meq/L (ref 137–147)
Total Protein: 7 g/dL (ref 6.0–8.3)

## 2014-05-01 LAB — GLUCOSE, CAPILLARY
GLUCOSE-CAPILLARY: 224 mg/dL — AB (ref 70–99)
Glucose-Capillary: 255 mg/dL — ABNORMAL HIGH (ref 70–99)
Glucose-Capillary: 214 mg/dL — ABNORMAL HIGH (ref 70–99)
Glucose-Capillary: 252 mg/dL — ABNORMAL HIGH (ref 70–99)
Glucose-Capillary: 220 mg/dL — ABNORMAL HIGH (ref 70–99)
Glucose-Capillary: 214 mg/dL — ABNORMAL HIGH (ref 70–99)

## 2014-05-01 LAB — HEMOGLOBIN A1C
HEMOGLOBIN A1C: 8.3 % — AB (ref <5.7)
Mean Plasma Glucose: 192 mg/dL — ABNORMAL HIGH (ref <117)

## 2014-05-01 MED ORDER — SODIUM CHLORIDE 0.9 % IV SOLN
INTRAVENOUS | Status: DC
  Administered 2014-05-01: 70 mL/h via INTRAVENOUS

## 2014-05-01 MED ORDER — INSULIN ASPART 100 UNIT/ML ~~LOC~~ SOLN
0.0000 [IU] | Freq: Three times a day (TID) | SUBCUTANEOUS | Status: DC
  Administered 2014-05-01: 8 [IU] via SUBCUTANEOUS
  Administered 2014-05-01: 5 [IU] via SUBCUTANEOUS

## 2014-05-01 MED ORDER — INSULIN GLARGINE 100 UNIT/ML ~~LOC~~ SOLN: 12.0000 [IU] | Freq: Every day | SUBCUTANEOUS | Status: DC

## 2014-05-01 MED ORDER — INSULIN ASPART 100 UNIT/ML ~~LOC~~ SOLN
0.0000 [IU] | Freq: Every day | SUBCUTANEOUS | Status: DC
  Administered 2014-05-01: 2 [IU] via SUBCUTANEOUS

## 2014-05-01 MED ORDER — ACETAMINOPHEN 325 MG PO TABS
650.0000 mg | ORAL_TABLET | Freq: Once | ORAL | Status: AC
  Administered 2014-05-02: 650 mg via ORAL
  Filled 2014-05-01: qty 2

## 2014-05-01 MED ORDER — LISINOPRIL 10 MG PO TABS: 10.0000 mg | ORAL_TABLET | Freq: Every day | ORAL | Status: DC

## 2014-05-01 MED ORDER — INSULIN GLARGINE 100 UNIT/ML ~~LOC~~ SOLN
20.0000 [IU] | Freq: Every day | SUBCUTANEOUS | Status: DC
  Administered 2014-05-01: 20 [IU] via SUBCUTANEOUS
  Filled 2014-05-01 (×4): qty 0.2

## 2014-05-01 MED ORDER — NICOTINE 21 MG/24HR TD PT24
21.0000 mg | MEDICATED_PATCH | Freq: Every day | TRANSDERMAL | Status: DC
  Administered 2014-05-01: 21 mg via TRANSDERMAL
  Filled 2014-05-01 (×2): qty 1

## 2014-05-01 NOTE — Progress Notes (Signed)
Dr Sherrie Mustachefisher called me to notify me that she would like for the insulin pump to be discontinued and for him to use our SSI/CBG.  I verbalized understanding and voiced to her that I had placed a consult in for the diabetic coordinator.  She verbalized understanding.  I discussed this with the patient and he then at that time discontinued the insulin pump.  The insulin pump was placed in a plastic bag and given to the patient to be placed with his belongings.

## 2014-05-01 NOTE — Progress Notes (Signed)
Podiatry Progress Note  Subjective:  William Fuentes is a 53 y.o. male who was admitted yesterday with cellulitis of the right foot.  He denies any new complaints.  He denies any nausea, vomiting, fever or chills.  His wife and son are present at bedside.  Past Medical History  Diagnosis Date  . Diabetes mellitus with neuropathy     and nephropathy  . Hypertension   . CKD (chronic kidney disease), stage III   . Seizure disorder   . Cellulitis and abscess of foot 04/30/2014   Scheduled Meds: Continuous Infusions: PRN Meds:.ondansetron (ZOFRAN) IV, ondansetron  No Known Allergies Past Surgical History  Procedure Laterality Date  . Brain surgery      2013  . 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  Marland Kitchen Hernia repair     History reviewed. No pertinent family history. Social History:  reports that he has been smoking.  He does not have any smokeless tobacco history on file. He reports that he does not drink alcohol. His drug history is not on file.  Physical Examination: Vital signs in last 24 hours:   Temp:  [98.2 F (36.8 C)-99.8 F (37.7 C)] 99.8 F (37.7 C) (07/24 1414) Pulse Rate:  [68-79] 75 (07/24 1715) Resp:  [20] 20 (07/24 1414) BP: (106-124)/(53-73) 124/73 mmHg (07/24 1715) SpO2:  [96 %-98 %] 98 % (07/24 1414)  A dressing is in place on both feet.  Skin of both feet is warm and dry.  There is a full-thickness ulceration present along the plantar aspect of the first metatarsal head of the left foot.  The ulceration measures 1.0 cm in length by 1.0 cm in width by 0.2 cm in depth.  The wound bed is granular with surrounding hyperkeratotic tissue present.  There are no acute signs of infection present surrounding this ulceration.  Following debridement, the ulceration measured 1.0 cm in length by 1.1 cm in width by 0.2 cm in depth.  There is a full-thickness ulceration present along the plantar aspect of the third metatarsal head of the left foot.  The ulceration  measures 0.8 cm in length by 0.5 cm in length by 0.2 cm in depth.  The wound bed is granular with surrounding hyperkeratotic tissue present.  There no acute signs of infection present surrounding the ulceration.  Following debridement, the ulceration measured  0.9 cm in length by 0.8 cm in width by 0.2 cm in depth. There is a full-thickness ulceration present along the plantar aspect of the fifth metatarsal head of the right foot.  The ulceration measures 1.6 cm in length by 2.0 cm in length by 0.3 cm in depth.  The wound bed is comprised of fibrotic and granular tissue.  The ulceration does not probe to bone.  There is surrounding hyperkeratotic tissue present.  Following debridement, the ulceration measured 1.7 cm in length by 2.2 cm in width by 0.2 cm in depth.  The periwound is improved.  Dorsalis pedis pulses palpable bilaterally.  Posterior tibial pulses palpable bilaterally.  Calves are soft and nontender bilaterally.  Protective sensation is absent.  Lab/Test Results:    Recent Labs  04/30/14 1555 05/01/14 0513  WBC 10.2 7.5  HGB 12.6* 11.4*  HCT 36.4* 34.0*  PLT 207 185  NA 136* 138  K 4.9 5.5*  CL 99 102  CO2 23 25  BUN 36* 34*  CREATININE 2.03* 2.18*  GLUCOSE 213* 249*  CALCIUM 9.4 9.1    No results  found for this or any previous visit (from the past 240 hour(s)).   Dg Skull 1-3 Views  04/30/2014   CLINICAL DATA:  Pre MRI screening.  History of brain surgery.  EXAM: SKULL - 1-3 VIEW  COMPARISON:  None.  FINDINGS: The patient is status post left parietal craniotomy. No radiopaque foreign body which would preclude MRI scanning is identified.  IMPRESSION: The patient may proceed to MRI.   Electronically Signed   By: Drusilla Kanner M.D.   On: 04/30/2014 19:10   Mr Foot Right Wo Contrast  04/30/2014   CLINICAL DATA:  History of prior amputation for osteomyelitis. Redness, swelling and pain of the right foot with a plantar ulcer. Symptoms for several weeks.  EXAM: MRI OF THE RIGHT  FOREFOOT WITHOUT CONTRAST  TECHNIQUE: Multiplanar, multisequence MR imaging was performed. No intravenous contrast was administered.  COMPARISON:  Plain films of the right foot earlier this same day.  FINDINGS: Again seen is amputation of the great toe. The patient is also status post amputation at the level the distal second metatarsal in mid third metatarsal. There is no marrow signal abnormality to suggest osteomyelitis.  Subcutaneous edema is seen over the dorsum of the foot. The patient has a fluid collection extending from the lateral aspect of the head of the first metatarsal along the distal, lateral metaphysis and subjacent to the metatarsal. Discrete measurement is not possible but the collection measures approximately 2.4 cm craniocaudal by 1.3 cm transverse by 1.6 cm long. A second smaller subcutaneous collection is seen more medially off the head of the first metatarsal measuring 0.9 cm craniocaudal by 0.9 cm transverse by 1 cm long. This collection appears to communicate with the skin surface.  No mass is identified. Intrinsic musculature of the foot is atrophied. No tendon tear is identified.  IMPRESSION: Two fluid collections about the first metatarsal head. The smaller of these collections appears to extends to the skin surface and could represent abscess. The second, larger collection extending laterally along the distal metaphysis of the first metatarsal could represent abscess but given the lack of surrounding inflammatory change or extension to the skin surface, it more likely represent a seroma or adventitial bursa.  Negative for osteomyelitis.   Electronically Signed   By: Drusilla Kanner M.D.   On: 04/30/2014 21:15   US Carotid Bilateral  05/01/2014   CLINICAL DATA:  Left carotid bruit  EXAM: BILATERAL CAROTID DUPLEX ULTRASOUND  TECHNIQUE: Wallace Cullens scale imaging, color Doppler and duplex ultrasound were performed of bilateral carotid and vertebral arteries in the neck.  COMPARISON:  None.   FINDINGS: Criteria: Quantification of carotid stenosis is based on velocity parameters that correlate the residual internal carotid diameter with NASCET-based stenosis levels, using the diameter of the distal internal carotid lumen as the denominator for stenosis measurement.  The following velocity measurements were obtained:  RIGHT  ICA:  86/34 cm/sec  CCA:  124/32 cm/sec  SYSTOLIC ICA/CCA RATIO:  0.69  DIASTOLIC ICA/CCA RATIO:  1.06  ECA:  100 cm/sec  LEFT  ICA:  101/40 cm/sec  CCA:  111/31 cm/sec  SYSTOLIC ICA/CCA RATIO:  0.91  DIASTOLIC ICA/CCA RATIO:  1.29  ECA:  101 cm/sec  RIGHT CAROTID ARTERY: Minor echogenic shadowing plaque formation. No hemodynamically significant right ICA stenosis, velocity elevation, or turbulent flow. Degree of narrowing less than 50%.  RIGHT VERTEBRAL ARTERY:  Antegrade  LEFT CAROTID ARTERY: Similar scattered minor echogenic plaque formation. No hemodynamically significant left ICA stenosis, velocity elevation, or turbulent flow.  LEFT VERTEBRAL ARTERY:  Antegrade  IMPRESSION: Minor carotid atherosclerosis. No hemodynamically significant ICA stenosis by ultrasound.   Electronically Signed   By: Ruel Favorsrevor  Shick M.D.   On: 05/01/2014 07:55   Dg Foot Complete Right  04/30/2014   CLINICAL DATA:  Pain and swelling.  EXAM: RIGHT FOOT COMPLETE - 3+ VIEW  COMPARISON:  None.  FINDINGS: Soft tissue swelling noted of the distal right foot. Patient has had prior right first, second, third digit amputations. No underlying bony erosions or acute bony abnormalities identified. No radiopaque foreign bodies. Vascular calcifications are present.  IMPRESSION: 1. Prominent soft tissue swelling noted over the distal right foot. Underlying soft tissue infection cannot be excluded. 2. No acute bony abnormalities identified. Patient has had prior first through third digit amputation. No evidence of osteomyelitis. 3. Peripheral vascular disease.   Electronically Signed   By: Maisie Fushomas  Register   On:  04/30/2014 13:44    Assessment: 1.  Cellulitis of the right foot 2.  Diabetic foot ulceration, right foot 3.  Diabetic foot ulceration, left foot 4.  Diabetes mellitus with peripheral neuropathy 5.  Renal disease (stage 3 per patient - labs pending) 6.  Hypertension 7.  History of alcohol abuse  Plan: An excisional full thickness debridement of the ulcerations was performed in an aseptic manner with a #15 blade down to and including epidermal tissue.  No anesthesia was required secondary to patient tolerance.  Hemostasis was achieved with compression.  An Aquacel dressing was applied to the ulcerations of both feet.  I will change his dressing again tomorrow.  I anticipate that he will be cleared for discharge, from my standpoint, tomorrow on PO antibiotics and local wound care.   Haruna Rohlfs IVAN 05/01/2014, 6:04 PM

## 2014-05-01 NOTE — Progress Notes (Signed)
ANTIBIOTIC CONSULT NOTE - follow up  Pharmacy Consult for Vancomycin & Zosyn Indication: cellultis, r/o osteomyelitis  No Known Allergies  Patient Measurements: Height: 6\' 1"  (185.4 cm) Weight: 162 lb (73.483 kg) IBW/kg (Calculated) : 79.9  Vital Signs: Temp: 99.2 F (37.3 C) (07/24 0628) Temp src: Oral (07/24 0628) BP: 106/53 mmHg (07/24 1015) Pulse Rate: 79 (07/24 1015) Intake/Output from previous day:   Intake/Output from this shift: Total I/O In: 240 [P.O.:240] Out: -   Labs:  Recent Labs  04/30/14 1555 05/01/14 0513  WBC 10.2 7.5  HGB 12.6* 11.4*  PLT 207 185  CREATININE 2.03* 2.18*   Estimated Creatinine Clearance: 41.2 ml/min (by C-G formula based on Cr of 2.18). No results found for this basename: VANCOTROUGH, VANCOPEAK, VANCORANDOM, GENTTROUGH, GENTPEAK, GENTRANDOM, TOBRATROUGH, TOBRAPEAK, TOBRARND, AMIKACINPEAK, AMIKACINTROU, AMIKACIN,  in the last 72 hours   Microbiology: No results found for this or any previous visit (from the past 720 hour(s)).  Medical History: Past Medical History  Diagnosis Date  . Diabetes mellitus with neuropathy     and nephropathy  . Hypertension   . CKD (chronic kidney disease), stage III   . Seizure disorder   . Cellulitis and abscess of foot 04/30/2014   Medications:  Scheduled:  . aspirin EC  81 mg Oral Daily  . carvedilol  25 mg Oral BID  . cloNIDine  0.1 mg Oral TID  . heparin  5,000 Units Subcutaneous 3 times per day  . hydrALAZINE  50 mg Oral TID  . insulin aspart  0-15 Units Subcutaneous TID WC  . insulin aspart  0-5 Units Subcutaneous QHS  . insulin glargine  12 Units Subcutaneous QHS  . levETIRAcetam  1,000 mg Oral BID  . magnesium oxide  400 mg Oral Daily  . multivitamin with minerals  1 tablet Oral Daily  . nicotine  21 mg Transdermal Daily  . pantoprazole  40 mg Oral Daily  . piperacillin-tazobactam (ZOSYN)  IV  3.375 g Intravenous Q8H  . sertraline  50 mg Oral Daily  . sodium chloride  3 mL  Intravenous Q12H  . vancomycin  750 mg Intravenous Q12H  . [START ON 05/23/2014] Vitamin D (Ergocalciferol)  50,000 Units Oral Q30 days   Assessment: 53 yo diabetic M with ulceration of right foot.  He is admitted for IV antibiotics & possible debridement.  Xray of foot did not show osteomyelitis, MRI does not show osteomyelitis.   He is currently afebrile with normal WBC. He has Stage III CKD.  Estimated CrCl ~40-245ml/min.  Vancomycin 7/23>> Zosyn 7/23>>  Goal of Therapy:  Vancomycin trough level 15-20 mcg/ml until osteo ruled out  Plan:  Zosyn 3.375gm IV Q8h to be infused over 4hrs Vancomycin 750mg  IV q12h Check Vancomycin trough at steady state Monitor renal function and cx data   William Fuentes, William Fuentes 05/01/2014,10:51 AM

## 2014-05-01 NOTE — Progress Notes (Signed)
[facility] PROGRESS NOTE  William Fuentes ZOX:[id number] DOB: 06/27/1961 DOA: 04/30/2014 PCP: No primary provider on file.    Code Status: Full code Family Communication: Discussed with patient, family not available. Disposition Plan: Discharge to home when clinically appropriate.   Consultants:  Podiatrist, Dr. Nolen Mu  Procedures:  Bedside also debridement, pending.  Antibiotics:  Zosyn 7/23>>  Vancomycin 7/23>>  HPI/Subjective: The patient has no complaints of foot pain, nausea, vomiting, chest pain, or shortness of breath.  Objective: Filed Vitals:   05/01/14 0628  BP:   Pulse: 79  Temp: 99.2 F (37.3 C)  Resp: 20   blood pressure 119/63. Oxygen saturation 98% on room air.   Intake/Output Summary (Last 24 hours) at 05/01/14 0955 Last data filed at 04/30/14 1800  Gross per 24 hour  Intake      0 ml  Output      0 ml  Net      0 ml   Filed Weights   04/30/14 1250 04/30/14 1638  Weight: 73.029 kg (161 lb) 73.483 kg (162 lb)    Exam:   General:  Alert 53 year old man laying in bed, in no acute distress.  Cardiovascular: S1, S2, with a soft systolic murmur.  Respiratory: Clear to auscultation bilaterally.  Abdomen: Positive bowel sounds, soft, nontender, nondistended.  Musculoskeletal/extremities: No pedal edema on the left, trace pedal edema on the right. Multiple digit amputations on the right foot  Skin: (Examined with Dr. Nolen Mu). 2 unstable plantar ulcers on the left foot with no surrounding erythema or fluctuance. No active purulent drainage. One unstageable plantar ulcer on the right foot with scant surrounding erythema. No malodorous drainage at this time.  Neurologic: He is alert and oriented x3. Cranial nerves II through XII are grossly intact.  Data Reviewed: Basic Metabolic Panel:  Recent Labs Lab 04/30/14 1555 05/01/14 0513  NA 136* 138  K 4.9 5.5*  CL 99 102  CO2 23 25  GLUCOSE 213* 249*  BUN 36* 34*  CREATININE 2.03*  2.18*  CALCIUM 9.4 9.1   Liver Function Tests:  Recent Labs Lab 05/01/14 0513  AST 17  ALT 17  ALKPHOS 62  BILITOT 0.3  PROT 7.0  ALBUMIN 3.3*   No results found for this basename: LIPASE, AMYLASE,  in the last 168 hours No results found for this basename: AMMONIA,  in the last 168 hours CBC:  Recent Labs Lab 04/30/14 1555 05/01/14 0513  WBC 10.2 7.5  HGB 12.6* 11.4*  HCT 36.4* 34.0*  MCV 90.3 90.7  PLT 207 185   Cardiac Enzymes: No results found for this basename: CKTOTAL, CKMB, CKMBINDEX, TROPONINI,  in the last 168 hours BNP (last 3 results) No results found for this basename: PROBNP,  in the last 8760 hours CBG:  Recent Labs Lab 04/30/14 1642 04/30/14 2159 05/01/14 0724  GLUCAP 221* 361* 255*    No results found for this or any previous visit (from the past 240 hour(s)).   Studies: Dg Skull 1-3 Views  04/30/2014   CLINICAL DATA:  Pre MRI screening.  History of brain surgery.  EXAM: SKULL - 1-3 VIEW  COMPARISON:  None.  FINDINGS: The patient is status post left parietal craniotomy. No radiopaque foreign body which would preclude MRI scanning is identified.  IMPRESSION: The patient may proceed to MRI.   Electronically Signed   By: Drusilla Kanner M.D.   On: 04/30/2014 19:10   Mr Foot Right Wo Contrast  04/30/2014   CLINICAL DATA:  History of  prior amputation for osteomyelitis. Redness, swelling and pain of the right foot with a plantar ulcer. Symptoms for several weeks.  EXAM: MRI OF THE RIGHT FOREFOOT WITHOUT CONTRAST  TECHNIQUE: Multiplanar, multisequence MR imaging was performed. No intravenous contrast was administered.  COMPARISON:  Plain films of the right foot earlier this same day.  FINDINGS: Again seen is amputation of the great toe. The patient is also status post amputation at the level the distal second metatarsal in mid third metatarsal. There is no marrow signal abnormality to suggest osteomyelitis.  Subcutaneous edema is seen over the dorsum of the  foot. The patient has a fluid collection extending from the lateral aspect of the head of the first metatarsal along the distal, lateral metaphysis and subjacent to the metatarsal. Discrete measurement is not possible but the collection measures approximately 2.4 cm craniocaudal by 1.3 cm transverse by 1.6 cm long. A second smaller subcutaneous collection is seen more medially off the head of the first metatarsal measuring 0.9 cm craniocaudal by 0.9 cm transverse by 1 cm long. This collection appears to communicate with the skin surface.  No mass is identified. Intrinsic musculature of the foot is atrophied. No tendon tear is identified.  IMPRESSION: Two fluid collections about the first metatarsal head. The smaller of these collections appears to extends to the skin surface and could represent abscess. The second, larger collection extending laterally along the distal metaphysis of the first metatarsal could represent abscess but given the lack of surrounding inflammatory change or extension to the skin surface, it more likely represent a seroma or adventitial bursa.  Negative for osteomyelitis.   Electronically Signed   By: Drusilla Kannerhomas  Dalessio M.D.   On: 04/30/2014 21:15   [place] Carotid Bilateral  05/01/2014   CLINICAL DATA:  Left carotid bruit  EXAM: BILATERAL CAROTID DUPLEX ULTRASOUND  TECHNIQUE: Wallace CullensGray scale imaging, color Doppler and duplex ultrasound were performed of bilateral carotid and vertebral arteries in the neck.  COMPARISON:  None.  FINDINGS: Criteria: Quantification of carotid stenosis is based on velocity parameters that correlate the residual internal carotid diameter with NASCET-based stenosis levels, using the diameter of the distal internal carotid lumen as the denominator for stenosis measurement.  The following velocity measurements were obtained:  RIGHT  ICA:  86/34 cm/sec  CCA:  124/32 cm/sec  SYSTOLIC ICA/CCA RATIO:  0.69  DIASTOLIC ICA/CCA RATIO:  1.06  ECA:  100 cm/sec  LEFT  ICA:  101/40  cm/sec  CCA:  111/31 cm/sec  SYSTOLIC ICA/CCA RATIO:  0.91  DIASTOLIC ICA/CCA RATIO:  1.29  ECA:  101 cm/sec  RIGHT CAROTID ARTERY: Minor echogenic shadowing plaque formation. No hemodynamically significant right ICA stenosis, velocity elevation, or turbulent flow. Degree of narrowing less than 50%.  RIGHT VERTEBRAL ARTERY:  Antegrade  LEFT CAROTID ARTERY: Similar scattered minor echogenic plaque formation. No hemodynamically significant left ICA stenosis, velocity elevation, or turbulent flow.  LEFT VERTEBRAL ARTERY:  Antegrade  IMPRESSION: Minor carotid atherosclerosis. No hemodynamically significant ICA stenosis by ultrasound.   Electronically Signed   By: Ruel Favorsrevor  Shick M.D.   On: 05/01/2014 07:55   Dg Foot Complete Right  04/30/2014   CLINICAL DATA:  Pain and swelling.  EXAM: RIGHT FOOT COMPLETE - 3+ VIEW  COMPARISON:  None.  FINDINGS: Soft tissue swelling noted of the distal right foot. Patient has had prior right first, second, third digit amputations. No underlying bony erosions or acute bony abnormalities identified. No radiopaque foreign bodies. Vascular calcifications are present.  IMPRESSION: 1. Prominent  soft tissue swelling noted over the distal right foot. Underlying soft tissue infection cannot be excluded. 2. No acute bony abnormalities identified. Patient has had prior first through third digit amputation. No evidence of osteomyelitis. 3. Peripheral vascular disease.   Electronically Signed   By: Maisie Fus  Register   On: 04/30/2014 13:44    Scheduled Meds: . aspirin EC  81 mg Oral Daily  . carvedilol  25 mg Oral BID  . cloNIDine  0.1 mg Oral TID  . heparin  5,000 Units Subcutaneous 3 times per day  . hydrALAZINE  50 mg Oral TID  . hydrochlorothiazide  12.5 mg Oral Daily  . insulin aspart  0-5 Units Subcutaneous QHS  . insulin aspart  0-9 Units Subcutaneous TID WC  . levETIRAcetam  1,000 mg Oral BID  . lisinopril  40 mg Oral Daily  . magnesium oxide  400 mg Oral Daily  . multivitamin  with minerals  1 tablet Oral Daily  . pantoprazole  40 mg Oral Daily  . piperacillin-tazobactam (ZOSYN)  IV  3.375 g Intravenous Q8H  . sertraline  50 mg Oral Daily  . sodium chloride  3 mL Intravenous Q12H  . vancomycin  750 mg Intravenous Q12H  . [START ON 05/23/2014] Vitamin D (Ergocalciferol)  50,000 Units Oral Q30 days   Continuous Infusions: . sodium chloride    . insulin pump     Assessment/plan:   Principal Problem:   Cellulitis and abscess of foot Active Problems:   Diabetic ulcer of both feet   HTN (hypertension)   Diabetes mellitus with neuropathy   CKD (chronic kidney disease), stage III   Anemia, unspecified   Tobacco abuse   1. Cellulitis and abscess and ulceration of plantar surface of right foot.  MRI revealed no evidence of osteomyelitis. The patient is afebrile and his white blood cell count is within normal limits. We will continue vancomycin and Zosyn. Dr. Nolen Mu will perform a bedside incision and drainage today. The patient will likely need home health wound care. We'll try to arrange this.  Diabetic foot ulcers on the left foot. Currently stable per Dr. Nolen Mu. No obvious signs of infection.  Diabetes mellitus with neuropathy/peripheral vascular disease. He is treated chronically with an insulin pump. His blood glucoses greater than 200. While he is in the hospital, will discontinue the insulin pump and start sliding scale NovoLog and Lantus. We'll order hemoglobin A1c. Diabetes coordinator evaluation and recommendations are pending.  Hypertension. His blood pressure is well controlled on multiple hypertensive medications including hydralazine, carvedilol, clonidine, and lisinopril. We'll discontinue lisinopril and hold hydrochlorothiazide because his blood pressures are on the low-normal side and his serum potassium is elevated (apparently already given by nursing this morning).  Stage III chronic kidney disease, per history. His baseline creatinine is  unknown. We'll hold lisinopril due to to mild hyperkalemia.  Mild hyperkalemia. We'll hold lisinopril. We'll add gentle IV fluids which will help. Continue to monitor his serum potassium. (Will give 1 dose of Kayexalate since lisinopril was already given).  Tobacco abuse. The patient was advised to stop smoking. We'll add a nicotine patch and order tobacco cessation counseling.  Anemia. This is likely from chronic disease/chronic kidney disease.  Seizure disorder. We'll continue Keppra.    Time spent: 35 minutes.    [employer] [facility]  [facility] Pager [phone]. If 7PM-7AM, please contact night-coverage at [email or web address], password [facility] 05/01/2014, 9:55 AM  LOS: 1 day

## 2014-05-01 NOTE — Progress Notes (Signed)
Inpatient Diabetes Program Recommendations  AACE/ADA: New Consensus Statement on Inpatient Glycemic Control (2013)  Target Ranges:  Prepandial:   less than 140 mg/dL      Peak postprandial:   less than 180 mg/dL (1-2 hours)      Critically ill patients:  140 - 180 mg/dL   Results for William Fuentes, William Fuentes (MRN [id number]) as of 05/01/2014 11:28  Ref. Range 04/30/2014 16:42 04/30/2014 21:59 05/01/2014 07:24 05/01/2014 09:45  Glucose-Capillary Latest Range: 70-99 mg/dL 409221 (H) 811361 (H) 914255 (H) 224 (H)   Diabetes history: DM1 dx at the age of 53 years old Outpatient Diabetes medications: Medtronic insulin pump with Humalog insulin Current orders for Inpatient glycemic control: Lantus 20 units daily, Novolog 0-15 units AC, Novolog 0-5 units HS  Inpatient Diabetes Program Recommendations Insulin - Basal: Recommended Lantus be changed to 20 units daily and discussed with Dr. Sherrie MustacheFisher and received order to change.  Note: Spoke with patient regarding diabetes and home regimen for diabetes management.  Patient states that he was diagnosed with diabetes at the age of 53 years old and he is followed by Dr. Glori Bickersrivedi Rajendra in [place] for diabetes management. Patient uses a Medtronic insulin pump with Humalog insulin as an outpatient. Dr. Sherrie MustacheFisher has requested patient remove insulin pump and will order SQ insulin while in the hospital. Patient has insulin pump in the bed with him but it is not connected nor does it have an insulin reservoir in it at this time. Patient states that he would prefer to use his insulin pump but now that he has removed it he does not have any extra insulin pump supplies with him here at the hospital and he will need to get his wife bring his insulin pump supplies here to the hospital so he can resume his pump at time of discharge.   Current insulin pump settings are as follows:  Basal insulin  12A 0.9 units/hour Total daily basal insulin: 21.6 units/24 hours  Carb Coverage 1:12 1 unit  for every 12 grams of carbohydrates  Insulin Sensitivity 1:45 1 unit drops blood glucose 45 mg/dl  Target Glucose Goals 78G12A 100-120 mg/dl 7A [id number] mg/dl  In talking with the patient he states that his blood glucose normally runs very good and his last A1C was 7.6%.  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  Since he has stopped drinking alcohol he reports that he blood glucose is much better. Informed patient that he would be receiving Lantus and Novolog for inpatient glycemic control.  Patient agreeable to using SQ insulin and states that he is suppose to go home tomorrow. Explained Lantus duration and advised patient that if he is discharged and resumes his insulin pump, he will not want to resume his basal insulin until 24 hours after the Lantus is given today since it will still be active. Patient verbalized understanding of information discussed and states that he does not have any further questions related to diabetes at this time.  Thanks, Orlando PennerMarie Atwood Adcock, RN, MSN, CCRN Diabetes Coordinator Inpatient Diabetes Program [phone] (Team Pager) [phone] ([facility] office) [phone] [facility] office)

## 2014-05-01 NOTE — Progress Notes (Signed)
UR chart review completed.  

## 2014-05-02 DIAGNOSIS — D649 Anemia, unspecified: Secondary | ICD-10-CM

## 2014-05-02 LAB — BASIC METABOLIC PANEL
ANION GAP: 13 (ref 5–15)
BUN: 32 mg/dL — ABNORMAL HIGH (ref 6–23)
CO2: 23 mEq/L (ref 19–32)
Calcium: 8.8 mg/dL (ref 8.4–10.5)
Chloride: 99 mEq/L (ref 96–112)
Creatinine, Ser: 2.42 mg/dL — ABNORMAL HIGH (ref 0.50–1.35)
GFR calc Af Amer: 34 mL/min — ABNORMAL LOW (ref >90)
GFR, EST NON AFRICAN AMERICAN: 29 mL/min — AB (ref >90)
GLUCOSE: 319 mg/dL — AB (ref 70–99)
POTASSIUM: 4.8 meq/L (ref 3.7–5.3)
SODIUM: 135 meq/L — AB (ref 137–147)

## 2014-05-02 LAB — GLUCOSE, CAPILLARY
GLUCOSE-CAPILLARY: 322 mg/dL — AB (ref 70–99)
Glucose-Capillary: 253 mg/dL — ABNORMAL HIGH (ref 70–99)
Glucose-Capillary: 165 mg/dL — ABNORMAL HIGH (ref 70–99)

## 2014-05-02 LAB — CBC
HCT: 34.1 % — ABNORMAL LOW (ref 39.0–52.0)
HEMOGLOBIN: 11.6 g/dL — AB (ref 13.0–17.0)
MCH: 30.7 pg (ref 26.0–34.0)
MCHC: 34 g/dL (ref 30.0–36.0)
MCV: 90.2 fL (ref 78.0–100.0)
PLATELETS: 155 10*3/uL (ref 150–400)
RBC: 3.78 MIL/uL — ABNORMAL LOW (ref 4.22–5.81)
RDW: 12.6 % (ref 11.5–15.5)
WBC: 7.9 10*3/uL (ref 4.0–10.5)

## 2014-05-02 MED ORDER — PREDNISONE 10 MG PO TABS
10.0000 mg | ORAL_TABLET | Freq: Once | ORAL | Status: AC
  Administered 2014-05-02: 10 mg via ORAL
  Filled 2014-05-02: qty 1

## 2014-05-02 MED ORDER — INSULIN ASPART 100 UNIT/ML ~~LOC~~ SOLN: 0.0000 [IU] | Freq: Every day | SUBCUTANEOUS | Status: DC

## 2014-05-02 MED ORDER — LISINOPRIL 40 MG PO TABS: 20.0000 mg | ORAL_TABLET | Freq: Every day | ORAL | Status: AC

## 2014-05-02 MED ORDER — VANCOMYCIN HCL IN DEXTROSE 1-5 GM/200ML-% IV SOLN
1000.0000 mg | INTRAVENOUS | Status: DC
  Filled 2014-05-02 (×2): qty 200

## 2014-05-02 MED ORDER — CIPROFLOXACIN HCL 500 MG PO TABS: 500.0000 mg | ORAL_TABLET | Freq: Every day | ORAL | Status: DC

## 2014-05-02 MED ORDER — DIPHENHYDRAMINE HCL 25 MG PO CAPS: 25.0000 mg | ORAL_CAPSULE | Freq: Four times a day (QID) | ORAL | Status: DC | PRN

## 2014-05-02 MED ORDER — INSULIN ASPART 100 UNIT/ML ~~LOC~~ SOLN
0.0000 [IU] | Freq: Three times a day (TID) | SUBCUTANEOUS | Status: DC
  Administered 2014-05-02: 20 [IU] via SUBCUTANEOUS
  Administered 2014-05-02: 4 [IU] via SUBCUTANEOUS

## 2014-05-02 MED ORDER — INSULIN GLARGINE 100 UNIT/ML ~~LOC~~ SOLN
25.0000 [IU] | Freq: Every day | SUBCUTANEOUS | Status: DC
  Filled 2014-05-02 (×2): qty 0.25

## 2014-05-02 MED ORDER — DIPHENHYDRAMINE HCL 25 MG PO CAPS
25.0000 mg | ORAL_CAPSULE | Freq: Once | ORAL | Status: AC
  Administered 2014-05-02: 25 mg via ORAL
  Filled 2014-05-02: qty 1

## 2014-05-02 NOTE — Progress Notes (Signed)
Podiatry Progress Note  Subjective:  William Fuentes is a 53 y.o. male admitted with cellulitis of the right foot.  He denies any new complaints.  He denies any nausea, vomiting, fever or chills.  He relates some diarrhea today.    Past Medical History  Diagnosis Date  . Diabetes mellitus with neuropathy     and nephropathy  . Hypertension   . CKD (chronic kidney disease), stage III   . Seizure disorder   . Cellulitis and abscess of foot 04/30/2014   Scheduled Meds: Continuous Infusions: PRN Meds:.ondansetron (ZOFRAN) IV, ondansetron  No Known Allergies Past Surgical History  Procedure Laterality Date  . Brain surgery      2013  . 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  Marland Kitchen. Hernia repair     History reviewed. No pertinent family history. Social History:  reports that he has been smoking.  He does not have any smokeless tobacco history on file. He reports that he does not drink alcohol. His drug history is not on file.  Physical Examination: Vital signs in last 24 hours:   Temp:  [98 F (36.7 C)-99.8 F (37.7 C)] 98.6 F (37 C) (07/25 0552) Pulse Rate:  [75-76] 76 (07/25 0552) Resp:  [20] 20 (07/25 0552) BP: (112-126)/(54-80) 126/80 mmHg (07/25 0552) SpO2:  [96 %-98 %] 96 % (07/25 0552)  A dressing is in place on both feet.  Skin of both feet is warm and dry.  There is a full-thickness ulceration present along the plantar aspect of the first metatarsal head of the left foot.  The ulceration measures 1.0 cm in length by 0.9 cm in width by 0.2 cm in depth.  The wound bed is granular.  There are no acute signs of infection present surrounding this ulceration.  There is a full-thickness ulceration present along the plantar aspect of the third metatarsal head of the left foot.  The ulceration measures 0.8 cm in length by 0.7 cm in length by 0.2 cm in depth.  The wound bed is granular.  There no acute signs of infection present surrounding the ulceration. There is a  full-thickness ulceration present along the plantar aspect of the fifth metatarsal head of the right foot.  The ulceration measures 1.6 cm in length by 2.0 cm in length by 0.2 cm in depth.  The wound bed is comprised of granular tissue. The periwound rubor is improved.  Dorsalis pedis pulses palpable bilaterally.  Posterior tibial pulses palpable bilaterally.  Calves are soft and nontender bilaterally.   Lab/Test Results:    Recent Labs  05/01/14 0513 05/02/14 0617  WBC 7.5 7.9  HGB 11.4* 11.6*  HCT 34.0* 34.1*  PLT 185 155  NA 138 135*  K 5.5* 4.8  CL 102 99  CO2 25 23  BUN 34* 32*  CREATININE 2.18* 2.42*  GLUCOSE 249* 319*  CALCIUM 9.1 8.8    Recent Results (from the past 240 hour(s))  WOUND CULTURE     Status: None   Collection Time    05/01/14  5:45 PM      Result Value Ref Range Status   Specimen Description FOOT   Final   Special Requests Normal   Final   Gram Stain     Final   Value: FEW WBC PRESENT, PREDOMINANTLY PMN     NO SQUAMOUS EPITHELIAL CELLS SEEN     NO ORGANISMS SEEN     Performed at [facility]   Culture  PENDING   Incomplete   Report Status PENDING   Incomplete     Dg Skull 1-3 Views  04/30/2014   CLINICAL DATA:  Pre MRI screening.  History of brain surgery.  EXAM: SKULL - 1-3 VIEW  COMPARISON:  None.  FINDINGS: The patient is status post left parietal craniotomy. No radiopaque foreign body which would preclude MRI scanning is identified.  IMPRESSION: The patient may proceed to MRI.   Electronically Signed   By: Drusilla Kanner M.D.   On: 04/30/2014 19:10   Mr Foot Right Wo Contrast  04/30/2014   CLINICAL DATA:  History of prior amputation for osteomyelitis. Redness, swelling and pain of the right foot with a plantar ulcer. Symptoms for several weeks.  EXAM: MRI OF THE RIGHT FOREFOOT WITHOUT CONTRAST  TECHNIQUE: Multiplanar, multisequence MR imaging was performed. No intravenous contrast was administered.  COMPARISON:  Plain films of the right  foot earlier this same day.  FINDINGS: Again seen is amputation of the great toe. The patient is also status post amputation at the level the distal second metatarsal in mid third metatarsal. There is no marrow signal abnormality to suggest osteomyelitis.  Subcutaneous edema is seen over the dorsum of the foot. The patient has a fluid collection extending from the lateral aspect of the head of the first metatarsal along the distal, lateral metaphysis and subjacent to the metatarsal. Discrete measurement is not possible but the collection measures approximately 2.4 cm craniocaudal by 1.3 cm transverse by 1.6 cm long. A second smaller subcutaneous collection is seen more medially off the head of the first metatarsal measuring 0.9 cm craniocaudal by 0.9 cm transverse by 1 cm long. This collection appears to communicate with the skin surface.  No mass is identified. Intrinsic musculature of the foot is atrophied. No tendon tear is identified.  IMPRESSION: Two fluid collections about the first metatarsal head. The smaller of these collections appears to extends to the skin surface and could represent abscess. The second, larger collection extending laterally along the distal metaphysis of the first metatarsal could represent abscess but given the lack of surrounding inflammatory change or extension to the skin surface, it more likely represent a seroma or adventitial bursa.  Negative for osteomyelitis.   Electronically Signed   By: Drusilla Kanner M.D.   On: 04/30/2014 21:15   US Carotid Bilateral  05/01/2014   CLINICAL DATA:  Left carotid bruit  EXAM: BILATERAL CAROTID DUPLEX ULTRASOUND  TECHNIQUE: Wallace Cullens scale imaging, color Doppler and duplex ultrasound were performed of bilateral carotid and vertebral arteries in the neck.  COMPARISON:  None.  FINDINGS: Criteria: Quantification of carotid stenosis is based on velocity parameters that correlate the residual internal carotid diameter with NASCET-based stenosis  levels, using the diameter of the distal internal carotid lumen as the denominator for stenosis measurement.  The following velocity measurements were obtained:  RIGHT  ICA:  86/34 cm/sec  CCA:  124/32 cm/sec  SYSTOLIC ICA/CCA RATIO:  0.69  DIASTOLIC ICA/CCA RATIO:  1.06  ECA:  100 cm/sec  LEFT  ICA:  101/40 cm/sec  CCA:  111/31 cm/sec  SYSTOLIC ICA/CCA RATIO:  0.91  DIASTOLIC ICA/CCA RATIO:  1.29  ECA:  101 cm/sec  RIGHT CAROTID ARTERY: Minor echogenic shadowing plaque formation. No hemodynamically significant right ICA stenosis, velocity elevation, or turbulent flow. Degree of narrowing less than 50%.  RIGHT VERTEBRAL ARTERY:  Antegrade  LEFT CAROTID ARTERY: Similar scattered minor echogenic plaque formation. No hemodynamically significant left ICA stenosis, velocity elevation, or  turbulent flow.  LEFT VERTEBRAL ARTERY:  Antegrade  IMPRESSION: Minor carotid atherosclerosis. No hemodynamically significant ICA stenosis by ultrasound.   Electronically Signed   By: Ruel Favors M.D.   On: 05/01/2014 07:55   Dg Foot Complete Right  04/30/2014   CLINICAL DATA:  Pain and swelling.  EXAM: RIGHT FOOT COMPLETE - 3+ VIEW  COMPARISON:  None.  FINDINGS: Soft tissue swelling noted of the distal right foot. Patient has had prior right first, second, third digit amputations. No underlying bony erosions or acute bony abnormalities identified. No radiopaque foreign bodies. Vascular calcifications are present.  IMPRESSION: 1. Prominent soft tissue swelling noted over the distal right foot. Underlying soft tissue infection cannot be excluded. 2. No acute bony abnormalities identified. Patient has had prior first through third digit amputation. No evidence of osteomyelitis. 3. Peripheral vascular disease.   Electronically Signed   By: Maisie Fus  Register   On: 04/30/2014 13:44    Assessment: 1.  Cellulitis of the right foot 2.  Diabetic foot ulceration, right foot 3.  Diabetic foot ulceration, left foot 4.  Diabetes mellitus  with peripheral neuropathy 5.  Renal disease (stage 3 per patient - labs pending) 6.  Hypertension 7.  History of alcohol abuse  Plan: An Aquacel dressing was applied to the ulcerations of both feet.  He states that his wife is able to change his dressings instead of home health.  I advised him not to remove his current dressings in place.  I advised him to keep his dressings clean and dry.  He is okay for discharge from my standpoint.  I recommend a 10 day course of po antibiotics (Augmentin 500 BID).  I will follow his culture results and change if necessary based on the sensitivity.  He is to follow-up with me at the [facility] office on Monday (05/04/2014) at 5:00 PM.   Tajuana Kniskern IVAN 05/02/2014, 12:53 PM

## 2014-05-02 NOTE — Discharge Summary (Signed)
Physician Discharge Summary  William Fuentes ZOX:[id number] DOB: May 29, 1961 DOA: 04/30/2014  PCP: Primary care and specialty physicians are in [place].  Admit date: 04/30/2014 Discharge date: 05/02/2014  Time spent: Greater than 30 minutes  Recommendations for Outpatient Follow-up:  1. Recommend followup evaluation of the patient's serum potassium and renal function. (The dose of lisinopril was decreased from 40 to 20 mg daily). 2. The patient will followup with Dr. Nolen Mu in 2 days for wound care/dressing changes.   Discharge Diagnoses:  1. Cellulitis and abscess of the right plantar surface ulcer. 2. 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 3. Stage III chronic kidney disease with mild acute renal insufficiency. 4. Hypertension. 5. Diabetes mellitus with neuropathy and nephropathy. 6. Normocytic anemia. 7. Seizure disorder. 8. Tobacco abuse.  Discharge Condition: Improved.  Diet recommendation: Carbohydrate modified/heart healthy.  Filed Weights   04/30/14 1250 04/30/14 1638  Weight: 73.029 kg (161 lb) 73.483 kg (162 lb)    History of present illness:  The patient is a 53 year old man with diabetes mellitus, on an insulin pump for 32 years, diabetic nephropathy/neuropathy, and hypertension, who presented to the ED on 04/30/2014 with worsening right foot ulceration swelling and redness. He also has plantar ulcerations on his left foot. He had been treated by his podiatrist, Dr. Alona Bene in [place]. However, his primary podiatrist was retiring and referred the patient to podiatrist, Dr. Nolen Mu. There was concern about a deeper infection, and therefore, the patient was admitted for further evaluation and management. In the ED, the patient was afebrile and hemodynamically stable. His white blood cell count was within normal limits at 10.2. X-ray of his right foot revealed prominent soft tissue swelling over the distal right foot; peripheral vascular disease; prior first  through third digit amputation; and no evidence of osteomyelitis. He was admitted for further evaluation and management.   Hospital Course:   1. Cellulitis and abscess and ulceration of plantar surface of right foot.  The patient was started on broad-spectrum antibiotics with IV vancomycin and Zosyn. Supportive treatment was given with gentle IV fluids and analgesics as needed. Dr. Nolen Mu was officially consulted. He recommended ordering an MRI for evaluation of a deeper infection and/or osteomyelitis. The MRI revealed no evidence of osteomyelitis. Dr. Nolen Mu decided to debride more of the right plantar ulceration at the bedside. This was performed successfully on 7/24. Specimen was sent for culturing. Results were pending at the time of discharge. Following the debridement, he dressed the wound with Aquacel dressings. He instructed the patient to not remove the dressings until he was reevaluated in the office in 2 days. Antibiotic therapy will be continued with Cipro for 7 more days. (Of note, Augmentin was going to be prescribed at the time of discharge, but the patient developed a papular rash which may have been secondary to Zosyn). The patient remained afebrile and his white blood cell count remained within normal limits.   Diabetic foot ulcers on the left foot.  These plantar ulcers were stable and noninfected appearing. Dr. Nolen Mu applied Aquacel dressings.  Diabetes mellitus with neuropathy/peripheral vascular disease.  He is treated chronically with an insulin pump. The insulin pump was placed on hold during this hospitalization. His diabetes was treated with sliding scale NovoLog and Lantus. The diabetes coordinator evaluated the patient and made recommendations. His hemoglobin A1c was 8.3. He was instructed to resume his home insulin pump dosing at the time of discharge.  Hypertension.  He was continued on his multiple hypertensive medications including hydralazine,  carvedilol,  clonidine, and lisinopril.  However, lisinopril and hydrochlorothiazide were eventually held the next day because his blood pressures were on the low-normal side, his creatinine had increased, and that his serum potassium was slightly elevated. At the time of discharge, he was instructed to resume hydrochlorothiazide and lisinopril, but to decrease lisinopril from 40 mg to 20 mg daily. He voiced understanding.  Stage III chronic kidney disease, per history.  His baseline creatinine was unknown but he was reported to have stage III chronic kidney disease. His creatinine was 2.03 on admission and increased to 2.42 at the time of discharge. Hydrochlorothiazide and lisinopril were temporally withheld and he was hydrated with normal saline. At the time of discharge, lisinopril was restarted at 20 mg daily rather than 40 mg daily. He will need to have his renal function reassessed at his followup appointments.  Mild hyperkalemia.  His serum potassium increased to 5.5. Lisinopril was held temporarily. He was started on gentle IV fluids for hydration. He was given 1 dose of Kayexalate. His serum potassium improved to 4.8. He was instructed to take half of his usual dose of lisinopril at the time of discharge.   Tobacco abuse. The patient was advised to stop smoking. Nicotine replacement therapy and tobacco cessation counseling ordered.  Anemia.  This was likely from chronic disease/chronic kidney disease and/or hemodilution from IV fluids. His hemoglobin was 11.6 at the time of discharge. Seizure disorder. It remained stable. Keppra was continued.     Procedures:  Bedside incision and debridement right foot abscess/ulcer by Dr. Nolen Mu on 05/01/14.  Consultations:  Charlann Lange  Discharge Exam: Filed Vitals:   05/02/14 0552  BP: 126/80  Pulse: 76  Temp: 98.6 F (37 C)  Resp: 20    General: Alert 53 year old man laying in bed, in no acute distress. Cardiovascular: S1, S2, with  a soft systolic murmur. Respiratory: Clear to auscultation bilaterally. Abdomen: Positive bowel sounds, soft, nontender, nondistended. Musculoskeletal/extremities: No pedal edema on the left, trace pedal edema on the right. Multiple digit amputations on the right foot. Bandages on both feet, recently dressed, not taken off. Skin: Diffuse papular rash on arms, torso, back, and legs. Neurologic: He is alert and oriented x3. Cranial nerves II through XII are grossly intact.     Discharge Instructions You were cared for by a hospitalist during your hospital stay. If you have any questions about your discharge medications or the care you received while you were in the hospital after you are discharged, you can call the unit and asked to speak with the hospitalist on call if the hospitalist that took care of you is not available. Once you are discharged, your primary care physician will handle any further medical issues. Please note that NO REFILLS for any discharge medications will be authorized once you are discharged, as it is imperative that you return to your primary care physician (or establish a relationship with a primary care physician if you do not have one) for your aftercare needs so that they can reassess your need for medications and monitor your lab values.  Discharge Instructions   Diet - low sodium heart healthy    Complete by:  As directed      Diet Carb Modified    Complete by:  As directed      Discharge instructions    Complete by:  As directed   Decrease Lisinopril to 1/2 tablet daily until your kidney function blood test is rechecked.  Avoid smoking.  Increase activity slowly    Complete by:  As directed      Leave dressing on - Keep it clean, dry, and intact until clinic visit    Complete by:  As directed             Medication List         aspirin EC 81 MG tablet  Take 81 mg by mouth daily.     carvedilol 25 MG tablet  Commonly known as:  COREG  Take 1 tablet  by mouth 2 (two) times daily.     ciprofloxacin 500 MG tablet  Commonly known as:  CIPRO  Take 1 tablet (500 mg total) by mouth daily. Antibiotic to be taken for 7 more days starting tomorrow.     cloNIDine 0.1 MG tablet  Commonly known as:  CATAPRES  Take 1 tablet by mouth 3 (three) times daily.     diphenhydrAMINE 25 mg capsule  Commonly known as:  BENADRYL  Take 1 capsule (25 mg total) by mouth every 6 (six) hours as needed for itching (Rash).     hydrALAZINE 50 MG tablet  Commonly known as:  APRESOLINE  Take 1 tablet by mouth 3 (three) times daily.     hydrochlorothiazide 12.5 MG capsule  Commonly known as:  MICROZIDE  Take 1 capsule by mouth daily.     insulin pump Soln  Inject into the skin continuous. HUMALOG 100 unit/mL INSULIN     levETIRAcetam 1000 MG tablet  Commonly known as:  KEPPRA  Take 1 tablet by mouth 2 (two) times daily.     lisinopril 40 MG tablet  Commonly known as:  PRINIVIL,ZESTRIL  Take 0.5 tablets (20 mg total) by mouth daily.     magnesium oxide 400 MG tablet  Commonly known as:  MAG-OX  Take 400 mg by mouth daily.     multivitamin with minerals Tabs tablet  Take 1 tablet by mouth daily.     omeprazole 20 MG tablet  Commonly known as:  PRILOSEC OTC  Take 20 mg by mouth daily.     sertraline 50 MG tablet  Commonly known as:  ZOLOFT  Take 1 tablet by mouth daily.     Vitamin D (Ergocalciferol) 50000 UNITS Caps capsule  Commonly known as:  DRISDOL  Take 1 capsule by mouth every 30 (thirty) days.       Allergies  Allergen Reactions  . Zosyn [Piperacillin Sod-Tazobactam So]     Presumed causative agent for diffuse rash       Follow-up Information   Follow up with Dallas Schimke, DPM. (As scheduled on next Monday.)    Specialty:  Podiatry   Contact information:   [place] [address] [phone]        The results of significant diagnostics from this hospitalization (including imaging, microbiology, ancillary and  laboratory) are listed below for reference.    Significant Diagnostic Studies: Dg Skull 1-3 Views  04/30/2014   CLINICAL DATA:  Pre MRI screening.  History of brain surgery.  EXAM: SKULL - 1-3 VIEW  COMPARISON:  None.  FINDINGS: The patient is status post left parietal craniotomy. No radiopaque foreign body which would preclude MRI scanning is identified.  IMPRESSION: The patient may proceed to MRI.   Electronically Signed   By: Drusilla Kanner M.D.   On: 04/30/2014 19:10   Mr Foot Right Wo Contrast  04/30/2014   CLINICAL DATA:  History of prior amputation for osteomyelitis. Redness, swelling and pain of the  right foot with a plantar ulcer. Symptoms for several weeks.  EXAM: MRI OF THE RIGHT FOREFOOT WITHOUT CONTRAST  TECHNIQUE: Multiplanar, multisequence MR imaging was performed. No intravenous contrast was administered.  COMPARISON:  Plain films of the right foot earlier this same day.  FINDINGS: Again seen is amputation of the great toe. The patient is also status post amputation at the level the distal second metatarsal in mid third metatarsal. There is no marrow signal abnormality to suggest osteomyelitis.  Subcutaneous edema is seen over the dorsum of the foot. The patient has a fluid collection extending from the lateral aspect of the head of the first metatarsal along the distal, lateral metaphysis and subjacent to the metatarsal. Discrete measurement is not possible but the collection measures approximately 2.4 cm craniocaudal by 1.3 cm transverse by 1.6 cm long. A second smaller subcutaneous collection is seen more medially off the head of the first metatarsal measuring 0.9 cm craniocaudal by 0.9 cm transverse by 1 cm long. This collection appears to communicate with the skin surface.  No mass is identified. Intrinsic musculature of the foot is atrophied. No tendon tear is identified.  IMPRESSION: Two fluid collections about the first metatarsal head. The smaller of these collections appears to  extends to the skin surface and could represent abscess. The second, larger collection extending laterally along the distal metaphysis of the first metatarsal could represent abscess but given the lack of surrounding inflammatory change or extension to the skin surface, it more likely represent a seroma or adventitial bursa.  Negative for osteomyelitis.   Electronically Signed   By: Drusilla Kanner M.D.   On: 04/30/2014 21:15   US Carotid Bilateral  05/01/2014   CLINICAL DATA:  Left carotid bruit  EXAM: BILATERAL CAROTID DUPLEX ULTRASOUND  TECHNIQUE: Wallace Cullens scale imaging, color Doppler and duplex ultrasound were performed of bilateral carotid and vertebral arteries in the neck.  COMPARISON:  None.  FINDINGS: Criteria: Quantification of carotid stenosis is based on velocity parameters that correlate the residual internal carotid diameter with NASCET-based stenosis levels, using the diameter of the distal internal carotid lumen as the denominator for stenosis measurement.  The following velocity measurements were obtained:  RIGHT  ICA:  86/34 cm/sec  CCA:  124/32 cm/sec  SYSTOLIC ICA/CCA RATIO:  0.69  DIASTOLIC ICA/CCA RATIO:  1.06  ECA:  100 cm/sec  LEFT  ICA:  101/40 cm/sec  CCA:  111/31 cm/sec  SYSTOLIC ICA/CCA RATIO:  0.91  DIASTOLIC ICA/CCA RATIO:  1.29  ECA:  101 cm/sec  RIGHT CAROTID ARTERY: Minor echogenic shadowing plaque formation. No hemodynamically significant right ICA stenosis, velocity elevation, or turbulent flow. Degree of narrowing less than 50%.  RIGHT VERTEBRAL ARTERY:  Antegrade  LEFT CAROTID ARTERY: Similar scattered minor echogenic plaque formation. No hemodynamically significant left ICA stenosis, velocity elevation, or turbulent flow.  LEFT VERTEBRAL ARTERY:  Antegrade  IMPRESSION: Minor carotid atherosclerosis. No hemodynamically significant ICA stenosis by ultrasound.   Electronically Signed   By: Ruel Favors M.D.   On: 05/01/2014 07:55   Dg Foot Complete Right  04/30/2014   CLINICAL  DATA:  Pain and swelling.  EXAM: RIGHT FOOT COMPLETE - 3+ VIEW  COMPARISON:  None.  FINDINGS: Soft tissue swelling noted of the distal right foot. Patient has had prior right first, second, third digit amputations. No underlying bony erosions or acute bony abnormalities identified. No radiopaque foreign bodies. Vascular calcifications are present.  IMPRESSION: 1. Prominent soft tissue swelling noted over the distal right foot. Underlying  soft tissue infection cannot be excluded. 2. No acute bony abnormalities identified. Patient has had prior first through third digit amputation. No evidence of osteomyelitis. 3. Peripheral vascular disease.   Electronically Signed   By: Maisie Fushomas  Register   On: 04/30/2014 13:44    Microbiology: Recent Results (from the past 240 hour(s))  WOUND CULTURE     Status: None   Collection Time    05/01/14  5:45 PM      Result Value Ref Range Status   Specimen Description FOOT   Final   Special Requests Normal   Final   Gram Stain     Final   Value: FEW WBC PRESENT, PREDOMINANTLY PMN     NO SQUAMOUS EPITHELIAL CELLS SEEN     NO ORGANISMS SEEN     Performed at [facility]   Culture PENDING   Incomplete   Report Status PENDING   Incomplete     Labs: Basic Metabolic Panel:  Recent Labs Lab 04/30/14 1555 05/01/14 0513 05/02/14 0617  NA 136* 138 135*  K 4.9 5.5* 4.8  CL 99 102 99  CO2 23 25 23   GLUCOSE 213* 249* 319*  BUN 36* 34* 32*  CREATININE 2.03* 2.18* 2.42*  CALCIUM 9.4 9.1 8.8   Liver Function Tests:  Recent Labs Lab 05/01/14 0513  AST 17  ALT 17  ALKPHOS 62  BILITOT 0.3  PROT 7.0  ALBUMIN 3.3*   No results found for this basename: LIPASE, AMYLASE,  in the last 168 hours No results found for this basename: AMMONIA,  in the last 168 hours CBC:  Recent Labs Lab 04/30/14 1555 05/01/14 0513 05/02/14 0617  WBC 10.2 7.5 7.9  HGB 12.6* 11.4* 11.6*  HCT 36.4* 34.0* 34.1*  MCV 90.3 90.7 90.2  PLT 207 185 155   Cardiac  Enzymes: No results found for this basename: CKTOTAL, CKMB, CKMBINDEX, TROPONINI,  in the last 168 hours BNP: BNP (last 3 results) No results found for this basename: PROBNP,  in the last 8760 hours CBG:  Recent Labs Lab 05/01/14 1410 05/01/14 1611 05/01/14 2110 05/02/14 0725 05/02/14 1126  GLUCAP 252* 220* 214* 322* 165*       Signed:  Anson Peddie  [facility] 05/02/2014, 1:32 PM

## 2014-05-02 NOTE — Progress Notes (Addendum)
ANTIBIOTIC CONSULT NOTE - follow up  Pharmacy Consult for Vancomycin & Zosyn Indication: cellulitis  No Known Allergies  Patient Measurements: Height: 6\' 1"  (185.4 cm) Weight: 162 lb (73.483 kg) IBW/kg (Calculated) : 79.9  Vital Signs: Temp: 98.6 F (37 C) (07/25 0552) Temp src: Oral (07/25 0552) BP: 126/80 mmHg (07/25 0552) Pulse Rate: 76 (07/25 0552) Intake/Output from previous day: 07/24 0701 - 07/25 0700 In: 2188.8 [P.O.:720; I.V.:1468.8] Out: 750 [Urine:750] Intake/Output from this shift: Total I/O In: 240 [P.O.:240] Out: -   Labs:  Recent Labs  04/30/14 1555 05/01/14 0513 05/02/14 0617  WBC 10.2 7.5 7.9  HGB 12.6* 11.4* 11.6*  PLT 207 185 155  CREATININE 2.03* 2.18* 2.42*   Estimated Creatinine Clearance: 37.1 ml/min (by C-G formula based on Cr of 2.42). No results found for this basename: VANCOTROUGH, Leodis BinetVANCOPEAK, VANCORANDOM, GENTTROUGH, GENTPEAK, GENTRANDOM, TOBRATROUGH, TOBRAPEAK, TOBRARND, AMIKACINPEAK, AMIKACINTROU, AMIKACIN,  in the last 72 hours   Microbiology: Recent Results (from the past 720 hour(s))  WOUND CULTURE     Status: None   Collection Time    05/01/14  5:45 PM      Result Value Ref Range Status   Specimen Description FOOT   Final   Special Requests Normal   Final   Gram Stain     Final   Value: FEW WBC PRESENT, PREDOMINANTLY PMN     NO SQUAMOUS EPITHELIAL CELLS SEEN     NO ORGANISMS SEEN     Performed at [facility]   Culture PENDING   Incomplete   Report Status PENDING   Incomplete   Medical History: Past Medical History  Diagnosis Date  . Diabetes mellitus with neuropathy     and nephropathy  . Hypertension   . CKD (chronic kidney disease), stage III   . Seizure disorder   . Cellulitis and abscess of foot 04/30/2014   Medications:  Scheduled:  . aspirin EC  81 mg Oral Daily  . carvedilol  25 mg Oral BID  . cloNIDine  0.1 mg Oral TID  . heparin  5,000 Units Subcutaneous 3 times per day  . hydrALAZINE  50 mg  Oral TID  . insulin aspart  0-20 Units Subcutaneous TID WC  . insulin aspart  0-5 Units Subcutaneous QHS  . insulin glargine  25 Units Subcutaneous Daily  . levETIRAcetam  1,000 mg Oral BID  . magnesium oxide  400 mg Oral Daily  . multivitamin with minerals  1 tablet Oral Daily  . nicotine  21 mg Transdermal Daily  . pantoprazole  40 mg Oral Daily  . piperacillin-tazobactam (ZOSYN)  IV  3.375 g Intravenous Q8H  . sertraline  50 mg Oral Daily  . sodium chloride  3 mL Intravenous Q12H  . [START ON 05/03/2014] vancomycin  1,000 mg Intravenous Q24H  . [START ON 05/23/2014] Vitamin D (Ergocalciferol)  50,000 Units Oral Q30 days   Assessment: 53 yo diabetic M with ulceration of right foot.  He is admitted for IV antibiotics & possible debridement.  Xray of foot did not show osteomyelitis, MRI does not show osteomyelitis.   He is currently afebrile with normal WBC. He has Stage III CKD.  SCr is worse since admission.  Estimated Creatinine Clearance: 37.1 ml/min (by C-G formula based on Cr of 2.42).  Vancomycin 7/23>> Zosyn 7/23>>  Goal of Therapy:  Vancomycin trough level 10-15  Plan:  Zosyn 3.375gm IV Q8h to be infused over 4hrs Reduce Vancomycin to 1000mg  IV q24h Check Vancomycin trough at  steady state Monitor renal function and cx data   Valrie Hart A 05/02/2014,1:08 PM

## 2014-05-02 NOTE — Progress Notes (Signed)
Discharge instructions reviewed with patient.  Verbalized understanding.  IV removed and pressure drsg applied.

## 2014-05-05 LAB — WOUND CULTURE

## 2014-08-10 ENCOUNTER — Encounter (HOSPITAL_COMMUNITY): Payer: Self-pay | Admitting: *Deleted

## 2014-08-10 ENCOUNTER — Inpatient Hospital Stay (HOSPITAL_COMMUNITY): Payer: 59

## 2014-08-10 ENCOUNTER — Inpatient Hospital Stay (HOSPITAL_COMMUNITY)
Admission: EM | Admit: 2014-08-10 | Discharge: 2014-08-15 | DRG: 623 | Disposition: A | Discharge status: Home / Self Care | Payer: 59 | Source: Ambulatory Visit | Attending: Internal Medicine | Admitting: Internal Medicine

## 2014-08-10 DIAGNOSIS — Z9641 Presence of insulin pump (external) (internal): Secondary | ICD-10-CM | POA: Diagnosis present

## 2014-08-10 DIAGNOSIS — Z89411 Acquired absence of right great toe: Secondary | ICD-10-CM

## 2014-08-10 DIAGNOSIS — Z792 Long term (current) use of antibiotics: Secondary | ICD-10-CM

## 2014-08-10 DIAGNOSIS — I129 Hypertensive chronic kidney disease with stage 1 through stage 4 chronic kidney disease, or unspecified chronic kidney disease: Secondary | ICD-10-CM | POA: Diagnosis present

## 2014-08-10 DIAGNOSIS — R509 Fever, unspecified: Secondary | ICD-10-CM | POA: Diagnosis present

## 2014-08-10 DIAGNOSIS — F329 Major depressive disorder, single episode, unspecified: Secondary | ICD-10-CM | POA: Diagnosis present

## 2014-08-10 DIAGNOSIS — E1142 Type 2 diabetes mellitus with diabetic polyneuropathy: Secondary | ICD-10-CM | POA: Diagnosis present

## 2014-08-10 DIAGNOSIS — L97529 Non-pressure chronic ulcer of other part of left foot with unspecified severity: Secondary | ICD-10-CM | POA: Diagnosis present

## 2014-08-10 DIAGNOSIS — G40909 Epilepsy, unspecified, not intractable, without status epilepticus: Secondary | ICD-10-CM | POA: Diagnosis present

## 2014-08-10 DIAGNOSIS — Z794 Long term (current) use of insulin: Secondary | ICD-10-CM

## 2014-08-10 DIAGNOSIS — F17210 Nicotine dependence, cigarettes, uncomplicated: Secondary | ICD-10-CM | POA: Diagnosis present

## 2014-08-10 DIAGNOSIS — L97519 Non-pressure chronic ulcer of other part of right foot with unspecified severity: Secondary | ICD-10-CM | POA: Diagnosis present

## 2014-08-10 DIAGNOSIS — L02611 Cutaneous abscess of right foot: Secondary | ICD-10-CM | POA: Diagnosis present

## 2014-08-10 DIAGNOSIS — Z880 Allergy status to penicillin: Secondary | ICD-10-CM

## 2014-08-10 DIAGNOSIS — E11621 Type 2 diabetes mellitus with foot ulcer: Secondary | ICD-10-CM | POA: Diagnosis present

## 2014-08-10 DIAGNOSIS — Z720 Tobacco use: Secondary | ICD-10-CM

## 2014-08-10 DIAGNOSIS — K219 Gastro-esophageal reflux disease without esophagitis: Secondary | ICD-10-CM | POA: Diagnosis present

## 2014-08-10 DIAGNOSIS — L0390 Cellulitis, unspecified: Secondary | ICD-10-CM

## 2014-08-10 DIAGNOSIS — G8929 Other chronic pain: Secondary | ICD-10-CM | POA: Diagnosis present

## 2014-08-10 DIAGNOSIS — Z79899 Other long term (current) drug therapy: Secondary | ICD-10-CM

## 2014-08-10 DIAGNOSIS — E1140 Type 2 diabetes mellitus with diabetic neuropathy, unspecified: Secondary | ICD-10-CM | POA: Diagnosis present

## 2014-08-10 DIAGNOSIS — L03119 Cellulitis of unspecified part of limb: Secondary | ICD-10-CM | POA: Diagnosis present

## 2014-08-10 DIAGNOSIS — T798XXA Other early complications of trauma, initial encounter: Secondary | ICD-10-CM

## 2014-08-10 DIAGNOSIS — Z89429 Acquired absence of other toe(s), unspecified side: Secondary | ICD-10-CM

## 2014-08-10 DIAGNOSIS — N183 Chronic kidney disease, stage 3 unspecified: Secondary | ICD-10-CM | POA: Diagnosis present

## 2014-08-10 DIAGNOSIS — L03115 Cellulitis of right lower limb: Secondary | ICD-10-CM | POA: Diagnosis present

## 2014-08-10 DIAGNOSIS — Z7982 Long term (current) use of aspirin: Secondary | ICD-10-CM | POA: Diagnosis not present

## 2014-08-10 DIAGNOSIS — L089 Local infection of the skin and subcutaneous tissue, unspecified: Secondary | ICD-10-CM

## 2014-08-10 DIAGNOSIS — L0291 Cutaneous abscess, unspecified: Secondary | ICD-10-CM | POA: Diagnosis present

## 2014-08-10 DIAGNOSIS — L02619 Cutaneous abscess of unspecified foot: Secondary | ICD-10-CM

## 2014-08-10 DIAGNOSIS — T148XXA Other injury of unspecified body region, initial encounter: Secondary | ICD-10-CM

## 2014-08-10 DIAGNOSIS — I10 Essential (primary) hypertension: Secondary | ICD-10-CM | POA: Diagnosis present

## 2014-08-10 DIAGNOSIS — M869 Osteomyelitis, unspecified: Secondary | ICD-10-CM

## 2014-08-10 HISTORY — DX: Unspecified convulsions: R56.9

## 2014-08-10 MED ORDER — SODIUM CHLORIDE 0.9 % IJ SOLN
3.0000 mL | Freq: Two times a day (BID) | INTRAMUSCULAR | Status: DC
  Administered 2014-08-13 – 2014-08-15 (×4): 3 mL via INTRAVENOUS

## 2014-08-10 MED ORDER — POLYETHYLENE GLYCOL 3350 17 G PO PACK: 17.0000 g | PACK | Freq: Every day | ORAL | Status: DC | PRN

## 2014-08-10 MED ORDER — ASPIRIN EC 81 MG PO TBEC
81.0000 mg | DELAYED_RELEASE_TABLET | Freq: Every day | ORAL | Status: DC
  Administered 2014-08-11 – 2014-08-15 (×5): 81 mg via ORAL
  Filled 2014-08-10 (×5): qty 1

## 2014-08-10 MED ORDER — ONDANSETRON HCL 4 MG PO TABS: 4.0000 mg | ORAL_TABLET | Freq: Four times a day (QID) | ORAL | Status: DC | PRN

## 2014-08-10 MED ORDER — HYDRALAZINE HCL 25 MG PO TABS
50.0000 mg | ORAL_TABLET | Freq: Three times a day (TID) | ORAL | Status: DC
  Administered 2014-08-10 – 2014-08-15 (×14): 50 mg via ORAL
  Filled 2014-08-10 (×14): qty 2

## 2014-08-10 MED ORDER — ONDANSETRON HCL 4 MG/2ML IJ SOLN
4.0000 mg | Freq: Once | INTRAMUSCULAR | Status: AC
  Administered 2014-08-10: 4 mg via INTRAVENOUS
  Filled 2014-08-10: qty 2

## 2014-08-10 MED ORDER — SILVER SULFADIAZINE 1 % EX CREA
TOPICAL_CREAM | Freq: Every day | CUTANEOUS | Status: DC
  Administered 2014-08-10: 22:00:00 via TOPICAL
  Filled 2014-08-10: qty 85

## 2014-08-10 MED ORDER — ACETAMINOPHEN 650 MG RE SUPP: 650.0000 mg | Freq: Four times a day (QID) | RECTAL | Status: DC | PRN

## 2014-08-10 MED ORDER — CLONIDINE HCL 0.1 MG PO TABS
0.1000 mg | ORAL_TABLET | Freq: Three times a day (TID) | ORAL | Status: DC
  Administered 2014-08-10 – 2014-08-15 (×14): 0.1 mg via ORAL
  Filled 2014-08-10 (×14): qty 1

## 2014-08-10 MED ORDER — CEFEPIME HCL 1 G IJ SOLR
1.0000 g | Freq: Once | INTRAMUSCULAR | Status: AC
  Administered 2014-08-10: 1 g via INTRAVENOUS
  Filled 2014-08-10: qty 1

## 2014-08-10 MED ORDER — SODIUM CHLORIDE 0.9 % IJ SOLN: 3.0000 mL | INTRAMUSCULAR | Status: DC | PRN

## 2014-08-10 MED ORDER — INFLUENZA VAC SPLIT QUAD 0.5 ML IM SUSY
0.5000 mL | PREFILLED_SYRINGE | INTRAMUSCULAR | Status: AC
  Administered 2014-08-11: 0.5 mL via INTRAMUSCULAR
  Filled 2014-08-10: qty 0.5

## 2014-08-10 MED ORDER — SODIUM CHLORIDE 0.9 % IV SOLN: 250.0000 mL | INTRAVENOUS | Status: DC | PRN

## 2014-08-10 MED ORDER — SERTRALINE HCL 50 MG PO TABS
50.0000 mg | ORAL_TABLET | Freq: Every day | ORAL | Status: DC
  Administered 2014-08-10 – 2014-08-15 (×6): 50 mg via ORAL
  Filled 2014-08-10 (×6): qty 1

## 2014-08-10 MED ORDER — CARVEDILOL 12.5 MG PO TABS
25.0000 mg | ORAL_TABLET | Freq: Two times a day (BID) | ORAL | Status: DC
  Administered 2014-08-11 – 2014-08-15 (×8): 25 mg via ORAL
  Filled 2014-08-10 (×8): qty 2

## 2014-08-10 MED ORDER — HEPARIN SODIUM (PORCINE) 5000 UNIT/ML IJ SOLN
5000.0000 [IU] | Freq: Three times a day (TID) | INTRAMUSCULAR | Status: DC
Start: 2014-08-10 — End: 2014-08-13
  Administered 2014-08-10 – 2014-08-13 (×7): 5000 [IU] via SUBCUTANEOUS
  Filled 2014-08-10 (×8): qty 1

## 2014-08-10 MED ORDER — ONDANSETRON HCL 4 MG/2ML IJ SOLN: 4.0000 mg | Freq: Four times a day (QID) | INTRAMUSCULAR | Status: DC | PRN

## 2014-08-10 MED ORDER — SENNA 8.6 MG PO TABS
1.0000 | ORAL_TABLET | Freq: Two times a day (BID) | ORAL | Status: DC
  Administered 2014-08-10 – 2014-08-15 (×9): 8.6 mg via ORAL
  Filled 2014-08-10 (×10): qty 1

## 2014-08-10 MED ORDER — PNEUMOCOCCAL VAC POLYVALENT 25 MCG/0.5ML IJ INJ
0.5000 mL | INJECTION | INTRAMUSCULAR | Status: AC
  Administered 2014-08-11: 0.5 mL via INTRAMUSCULAR
  Filled 2014-08-10: qty 0.5

## 2014-08-10 MED ORDER — ACETAMINOPHEN 325 MG PO TABS: 650.0000 mg | ORAL_TABLET | Freq: Four times a day (QID) | ORAL | Status: DC | PRN

## 2014-08-10 MED ORDER — PANTOPRAZOLE SODIUM 20 MG PO TBEC
20.0000 mg | DELAYED_RELEASE_TABLET | Freq: Every day | ORAL | Status: DC
  Filled 2014-08-10 (×3): qty 1

## 2014-08-10 MED ORDER — LEVETIRACETAM 500 MG PO TABS
1000.0000 mg | ORAL_TABLET | Freq: Two times a day (BID) | ORAL | Status: DC
  Administered 2014-08-10 – 2014-08-15 (×10): 1000 mg via ORAL
  Filled 2014-08-10 (×11): qty 2

## 2014-08-10 MED ORDER — SILVER SULFADIAZINE 1 % EX CREA
TOPICAL_CREAM | CUTANEOUS | Status: AC
  Filled 2014-08-10: qty 50

## 2014-08-10 MED ORDER — VANCOMYCIN HCL IN DEXTROSE 1-5 GM/200ML-% IV SOLN
1000.0000 mg | Freq: Once | INTRAVENOUS | Status: AC
  Administered 2014-08-10: 1000 mg via INTRAVENOUS
  Filled 2014-08-10: qty 200

## 2014-08-10 MED ORDER — SODIUM CHLORIDE 0.9 % IV BOLUS (SEPSIS)
1000.0000 mL | Freq: Once | INTRAVENOUS | Status: AC
  Administered 2014-08-10: 1000 mL via INTRAVENOUS

## 2014-08-10 MED ORDER — DEXTROSE 5 % IV SOLN
INTRAVENOUS | Status: AC
  Filled 2014-08-10: qty 1

## 2014-08-10 MED ORDER — HYDROCHLOROTHIAZIDE 12.5 MG PO CAPS
12.5000 mg | ORAL_CAPSULE | Freq: Every day | ORAL | Status: DC
  Administered 2014-08-11 – 2014-08-15 (×5): 12.5 mg via ORAL
  Filled 2014-08-10 (×5): qty 1

## 2014-08-10 MED ORDER — LEVOFLOXACIN IN D5W 750 MG/150ML IV SOLN
750.0000 mg | Freq: Once | INTRAVENOUS | Status: DC
  Filled 2014-08-10: qty 150

## 2014-08-10 MED ORDER — VANCOMYCIN HCL IN DEXTROSE 1-5 GM/200ML-% IV SOLN
INTRAVENOUS | Status: AC
  Filled 2014-08-10: qty 200

## 2014-08-10 MED ORDER — NICOTINE 21 MG/24HR TD PT24
21.0000 mg | MEDICATED_PATCH | Freq: Every day | TRANSDERMAL | Status: DC
  Filled 2014-08-10 (×5): qty 1

## 2014-08-10 MED ORDER — LISINOPRIL 10 MG PO TABS
10.0000 mg | ORAL_TABLET | Freq: Every day | ORAL | Status: DC
  Administered 2014-08-11 – 2014-08-15 (×5): 10 mg via ORAL
  Filled 2014-08-10 (×5): qty 1

## 2014-08-10 NOTE — Progress Notes (Signed)
ANTIBIOTIC CONSULT NOTE - INITIAL  Pharmacy Consult for Cefepime & Vancomycin Indication: wound infection  Allergies  Allergen Reactions  . Zosyn [Piperacillin Sod-Tazobactam So]     Presumed causative agent for diffuse rash    Patient Measurements:   Last Recorded Body Weight: 73.5kg in July 2015  Vital Signs: Temp: 98.9 F (37.2 C) (11/02 2202) Temp Source: Oral (11/02 2202) BP: 131/59 mmHg (11/02 2202) Pulse Rate: 76 (11/02 2202) Intake/Output from previous day:   Intake/Output from this shift:    Labs: No results for input(s): WBC, HGB, PLT, LABCREA, CREATININE in the last 72 hours. CrCl cannot be calculated (Unknown ideal weight.). No results for input(s): VANCOTROUGH, VANCOPEAK, VANCORANDOM, GENTTROUGH, GENTPEAK, GENTRANDOM, TOBRATROUGH, TOBRAPEAK, TOBRARND, AMIKACINPEAK, AMIKACINTROU, AMIKACIN in the last 72 hours.   Microbiology: Recent Results (from the past 720 hour(s))  Culture, blood (routine x 2)     Status: None (Preliminary result)   Collection Time: 08/10/14  8:56 PM  Result Value Ref Range Status   Specimen Description LEFT ANTECUBITAL  Final   Special Requests BOTTLES DRAWN AEROBIC AND ANAEROBIC 8CC EACH  Final   Culture PENDING  Incomplete   Report Status PENDING  Incomplete  Culture, blood (routine x 2)     Status: None (Preliminary result)   Collection Time: 08/10/14  8:56 PM  Result Value Ref Range Status   Specimen Description RIGHT ANTECUBITAL  Final   Special Requests BOTTLES DRAWN AEROBIC AND ANAEROBIC [facility]6CC EACH  Final   Culture PENDING  Incomplete   Report Status PENDING  Incomplete    Medical History: Past Medical History  Diagnosis Date  . Diabetes mellitus with neuropathy     and nephropathy  . Hypertension   . CKD (chronic kidney disease), stage III   . Seizure disorder   . Cellulitis and abscess of foot 04/30/2014    Medications:  Scheduled:  . [START ON 08/11/2014] aspirin EC  81 mg Oral Daily  . [START ON 08/11/2014]  carvedilol  25 mg Oral BID WC  . ceFEPime (MAXIPIME) IV  1 g Intravenous Once  . cloNIDine  0.1 mg Oral TID  . heparin  5,000 Units Subcutaneous 3 times per day  . hydrALAZINE  50 mg Oral TID  . [START ON 08/11/2014] hydrochlorothiazide  12.5 mg Oral Daily  . [START ON 08/11/2014] Influenza vac split quadrivalent PF  0.5 mL Intramuscular Tomorrow-1000  . levETIRAcetam  1,000 mg Oral BID  . [START ON 08/11/2014] lisinopril  10 mg Oral Daily  . nicotine  21 mg Transdermal Daily  . pantoprazole  20 mg Oral Daily  . [START ON 08/11/2014] pneumococcal 23 valent vaccine  0.5 mL Intramuscular Tomorrow-1000  . senna  1 tablet Oral BID  . sertraline  50 mg Oral Daily  . silver sulfADIAZINE   Topical Daily  . sodium chloride  3 mL Intravenous Q12H  . vancomycin  1,000 mg Intravenous Once   Assessment: 53 yo diabetic male admitted from podiatrist office with worsening cellulitis/abscess of right foot.  Xray negative for osteomyelitis.   He has low grade fever on admission.   Labs from this admission pending.  Estimated CrCl based on weight & Scr from July admission ~ 4435ml/min.    Vancomycin 11/2>> Cefepime 11/2>>  Goal of Therapy:  Vancomycin trough level 15-20 mcg/ml  Plan:  Cefepime 1gm IV x1 Vancomycin 1gm IV x1 F/U renal fxn, weight for subsequent doses  Elson ClanLilliston, Jetaun Colbath Michelle 08/10/2014,10:09 PM

## 2014-08-10 NOTE — H&P (Addendum)
[facility] History and Physical  William FarrierMark Uresti UJW:[id number] DOB: 11-19-1960 DOA: 08/10/2014  Referring physician: Dr. Nolen MuMcKinney - Podiatrist  PCP: No primary care provider on file.   Chief Complaint: nausea and chills w/ foot ulcer  HPI: William Fuentes is a 53 y.o. male  R foot ulcer for months. New onset redness and discharge associated w/ fevers adn chills for the past 2 days. Went to DR. McKinney's office today and was directly admitted to [facility] for IV ABX. Plain film at McKinney's office w/o evidence of osteo. CBGs at home recently in the 300 range. Currently w/ insulin pump.   Continues to smoke 1ppd  Review of Systems:  Constitutional:  No weight loss, night sweats, fatigue. Fevers and chills as above HEENT:  No headaches, Difficulty swallowing,Tooth/dental problems,Sore throat,  No sneezing, itching, ear ache, nasal congestion, post nasal drip,  Cardio-vascular:  No chest pain, Orthopnea, PND, swelling in lower extremities, anasarca, dizziness, palpitations  GI:  No heartburn, indigestion, abdominal pain, nausea, vomiting, diarrhea, change in bowel habits, loss of appetite  Resp:  No shortness of breath with exertion or at rest. No excess mucus, no productive cough, No non-productive cough, No coughing up of blood.No change in color of mucus.No wheezing.No chest wall deformity  Skin:  no rash  Foot wound per HPI.  GU:  no dysuria, change in color of urine, no urgency or frequency. No flank pain.  Musculoskeletal:  No joint pain or swelling. No decreased range of motion. No back pain.  Psych:  No change in mood or affect. No depression or anxiety. No memory loss.   Past Medical History  Diagnosis Date  . Diabetes mellitus with neuropathy     and nephropathy  . Hypertension   . CKD (chronic kidney disease), stage III   . Seizure disorder   . Cellulitis and abscess of foot 04/30/2014   Past Surgical History  Procedure Laterality Date  . Brain surgery      2013  .  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  Marland Kitchen. Hernia repair     Social History:  reports that he has been smoking.  He does not have any smokeless tobacco history on file. He reports that he does not drink alcohol. His drug history is not on file.  Allergies  Allergen Reactions  . Zosyn [Piperacillin Sod-Tazobactam So]     Presumed causative agent for diffuse rash    History reviewed. No pertinent family history.   Prior to Admission medications   Medication Sig Start Date End Date Taking? Authorizing Provider  aspirin EC 81 MG tablet Take 81 mg by mouth daily.    Historical Provider, MD  carvedilol (COREG) 25 MG tablet Take 1 tablet by mouth 2 (two) times daily. 04/25/14   Historical Provider, MD  ciprofloxacin (CIPRO) 500 MG tablet Take 1 tablet (500 mg total) by mouth daily. Antibiotic to be taken for 7 more days starting tomorrow. 05/02/14   Elliot Cousinenise Fisher, MD  cloNIDine (CATAPRES) 0.1 MG tablet Take 1 tablet by mouth 3 (three) times daily. 02/22/14   Historical Provider, MD  diphenhydrAMINE (BENADRYL) 25 mg capsule Take 1 capsule (25 mg total) by mouth every 6 (six) hours as needed for itching (Rash). 05/02/14   Elliot Cousinenise Fisher, MD  hydrALAZINE (APRESOLINE) 50 MG tablet Take 1 tablet by mouth 3 (three) times daily. 04/24/14   Historical Provider, MD  hydrochlorothiazide (MICROZIDE) 12.5 MG capsule Take 1 capsule by mouth daily. 04/24/14  Historical Provider, MD  Insulin Human (INSULIN PUMP) SOLN Inject into the skin continuous. HUMALOG 100 unit/mL INSULIN    Historical Provider, MD  levETIRAcetam (KEPPRA) 1000 MG tablet Take 1 tablet by mouth 2 (two) times daily. 04/27/14   Historical Provider, MD  lisinopril (PRINIVIL,ZESTRIL) 40 MG tablet Take 0.5 tablets (20 mg total) by mouth daily. 05/02/14   Elliot Cousin, MD  magnesium oxide (MAG-OX) 400 MG tablet Take 400 mg by mouth daily.    Historical Provider, MD  Multiple Vitamin (MULTIVITAMIN WITH MINERALS) TABS tablet Take 1 tablet by  mouth daily.    Historical Provider, MD  omeprazole (PRILOSEC OTC) 20 MG tablet Take 20 mg by mouth daily.    Historical Provider, MD  sertraline (ZOLOFT) 50 MG tablet Take 1 tablet by mouth daily. 04/18/14   Historical Provider, MD  Vitamin D, Ergocalciferol, (DRISDOL) 50000 UNITS CAPS capsule Take 1 capsule by mouth every 30 (thirty) days.  04/18/14   Historical Provider, MD   Physical Exam: Filed Vitals:   08/10/14 1835  BP: 139/67  Pulse: 84  Temp: 100.6 F (38.1 C)  TempSrc: Oral  Resp: 16  SpO2: 98%    Wt Readings from Last 3 Encounters:  04/30/14 73.483 kg (162 lb)    General: Appears calm and comfortable Eyes: PERRL, normal lids, irises & conjunctiva ENT: grossly normal hearing, lips & tongue Neck: no LAD, masses or thyromegaly Cardiovascular: RRR, no m/r/g. No LE edema. Respiratory: CTA bilaterally, no w/r/r. Normal respiratory effort. Abdomen:  soft, ntnd Skin: R foot ulceration a the 4-5th MTP joint w/ central necrosis and purulence, nonttp,  Musculoskeletal:  grossly normal tone BUE/BLE Psychiatric:  grossly normal mood and affect, speech fluent and appropriate Neurologic:  grossly non-focal.          Labs on Admission:  Basic Metabolic Panel: No results for input(s): NA, K, CL, CO2, GLUCOSE, BUN, CREATININE, CALCIUM, MG, PHOS in the last 168 hours. Liver Function Tests: No results for input(s): AST, ALT, ALKPHOS, BILITOT, PROT, ALBUMIN in the last 168 hours. No results for input(s): LIPASE, AMYLASE in the last 168 hours. No results for input(s): AMMONIA in the last 168 hours. CBC: No results for input(s): WBC, NEUTROABS, HGB, HCT, MCV, PLT in the last 168 hours. Cardiac Enzymes: No results for input(s): CKTOTAL, CKMB, CKMBINDEX, TROPONINI in the last 168 hours.  BNP (last 3 results) No results for input(s): PROBNP in the last 8760 hours. CBG: No results for input(s): GLUCAP in the last 168 hours.  Radiological Exams on Admission: No results  found.  EKG: Independently reviewed. None  Assessment/Plan Principal Problem:   Cellulitis and abscess of foot Active Problems:   HTN (hypertension)   Diabetes mellitus with neuropathy   CKD (chronic kidney disease), stage III   Tobacco abuse   Seizure disorder  Cellulitis and abscess of the R foot: Chronic condition for pt w/ recent exacerbation. 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 Currently Febrile VSS. No labs yet as direct admit. Dr. Nolen Mu to follow during admission - Admit - Start IV Vanc and cefepime (MRSA and pseudo coverage, PCN allergy) - CBC w/ diff - CMET - BCX - WCX - Silvadene ointment and clean dressings - f/u Dr. Loralie Champagne recommendations - 1L NS bolus - Foot plain film to better evaluate for Osteo  HTN: Normotensive on admission. Pt on significant antihypertensive regimen. Improved lately per pt. Now taking 1/2 home lisinopril dose.  - Continue home carvedilol, clonidine, hydralazine,  - ASA  CKD III:  last Cr 2.42.  - CMET  DM: Last A1c 8.3 04/30/14. USes home insulin pump. CBGs elevated recently at home in the 300 range. Currently 312. - continue insulin pump. Pt aware that acute infection will likely increase glucose levels - A1c  Seizures: no recent seizure - continue keppra  Tobacco: continues to smoke 1ppd - nicotine patch  Depression:  - continue zoloft  Chronic pain:  - continue Norco  GERD: well controlled - continue ppi  Code Status: FULL DVT Prophylaxis: Hep [place] TID Family Communication: none Disposition Plan: pending improvement  Time spent: > 70 min in direct pt care and coordination.   MERRELL, DAVID J [facility] [email or web address] Password [email or web address]

## 2014-08-10 NOTE — Progress Notes (Signed)
CBG 255 taken by patient using his own personal glucometer.

## 2014-08-10 NOTE — Progress Notes (Signed)
Notified Dr. Konrad DoloresMerrell of the patients arrival to the floor and his complaint of nausea and wanting eat.  New orders given and followed.

## 2014-08-11 ENCOUNTER — Inpatient Hospital Stay (HOSPITAL_COMMUNITY): Payer: 59

## 2014-08-11 DIAGNOSIS — L0390 Cellulitis, unspecified: Secondary | ICD-10-CM

## 2014-08-11 DIAGNOSIS — L02619 Cutaneous abscess of unspecified foot: Secondary | ICD-10-CM

## 2014-08-11 DIAGNOSIS — L0291 Cutaneous abscess, unspecified: Secondary | ICD-10-CM | POA: Diagnosis present

## 2014-08-11 DIAGNOSIS — E1140 Type 2 diabetes mellitus with diabetic neuropathy, unspecified: Secondary | ICD-10-CM

## 2014-08-11 DIAGNOSIS — I10 Essential (primary) hypertension: Secondary | ICD-10-CM

## 2014-08-11 DIAGNOSIS — L03119 Cellulitis of unspecified part of limb: Secondary | ICD-10-CM

## 2014-08-11 LAB — BASIC METABOLIC PANEL
Anion gap: 11 (ref 5–15)
BUN: 29 mg/dL — ABNORMAL HIGH (ref 6–23)
CHLORIDE: 100 meq/L (ref 96–112)
CO2: 26 mEq/L (ref 19–32)
CREATININE: 2.04 mg/dL — AB (ref 0.50–1.35)
Calcium: 9 mg/dL (ref 8.4–10.5)
GFR calc non Af Amer: 36 mL/min — ABNORMAL LOW (ref >90)
GFR, EST AFRICAN AMERICAN: 41 mL/min — AB (ref >90)
Glucose, Bld: 224 mg/dL — ABNORMAL HIGH (ref 70–99)
POTASSIUM: 4.8 meq/L (ref 3.7–5.3)
Sodium: 137 mEq/L (ref 137–147)

## 2014-08-11 LAB — HEMOGLOBIN A1C
Hgb A1c MFr Bld: 7.4 % — ABNORMAL HIGH (ref <5.7)
MEAN PLASMA GLUCOSE: 166 mg/dL — AB (ref <117)

## 2014-08-11 MED ORDER — DEXTROSE 5 % IV SOLN
1.0000 g | Freq: Two times a day (BID) | INTRAVENOUS | Status: DC
  Administered 2014-08-11 – 2014-08-15 (×8): 1 g via INTRAVENOUS
  Filled 2014-08-11 (×12): qty 1

## 2014-08-11 MED ORDER — PANTOPRAZOLE SODIUM 40 MG PO TBEC
40.0000 mg | DELAYED_RELEASE_TABLET | Freq: Every day | ORAL | Status: DC
  Administered 2014-08-11 – 2014-08-15 (×5): 40 mg via ORAL
  Filled 2014-08-11 (×5): qty 1

## 2014-08-11 MED ORDER — VANCOMYCIN HCL IN DEXTROSE 750-5 MG/150ML-% IV SOLN
750.0000 mg | INTRAVENOUS | Status: DC
  Administered 2014-08-11 – 2014-08-13 (×3): 750 mg via INTRAVENOUS
  Filled 2014-08-11 (×6): qty 150

## 2014-08-11 NOTE — Progress Notes (Signed)
ANTIBIOTIC CONSULT NOTE - INITIAL  Pharmacy Consult for Cefepime & Vancomycin Indication: wound infection  Allergies  Allergen Reactions  . Zosyn [Piperacillin Sod-Tazobactam So]     Presumed causative agent for diffuse rash   Patient Measurements: Height: 6\' 1"  (185.4 cm) Weight: 146 lb 3.2 oz (66.316 kg) IBW/kg (Calculated) : 79.9  Vital Signs: Temp: 99.7 F (37.6 C) (11/03 0613) Temp Source: Oral (11/03 16100613) BP: 152/76 mmHg (11/03 0913) Pulse Rate: 68 (11/03 0913) Intake/Output from previous day:   Intake/Output from this shift:    Labs:  Recent Labs  08/11/14 1055  CREATININE 2.04*   Estimated Creatinine Clearance: 39.3 mL/min (by C-G formula based on Cr of 2.04). No results for input(s): VANCOTROUGH, VANCOPEAK, VANCORANDOM, GENTTROUGH, GENTPEAK, GENTRANDOM, TOBRATROUGH, TOBRAPEAK, TOBRARND, AMIKACINPEAK, AMIKACINTROU, AMIKACIN in the last 72 hours.   Microbiology: Recent Results (from the past 720 hour(s))  Culture, blood (routine x 2)     Status: None (Preliminary result)   Collection Time: 08/10/14  8:56 PM  Result Value Ref Range Status   Specimen Description LEFT ANTECUBITAL  Final   Special Requests BOTTLES DRAWN AEROBIC AND ANAEROBIC 8CC EACH  Final   Culture NO GROWTH 1 DAY  Final   Report Status PENDING  Incomplete  Culture, blood (routine x 2)     Status: None (Preliminary result)   Collection Time: 08/10/14  8:56 PM  Result Value Ref Range Status   Specimen Description RIGHT ANTECUBITAL  Final   Special Requests BOTTLES DRAWN AEROBIC AND ANAEROBIC 6CC EACH  Final   Culture NO GROWTH 1 DAY  Final   Report Status PENDING  Incomplete   Medical History: Past Medical History  Diagnosis Date  . Diabetes mellitus with neuropathy     and nephropathy  . Hypertension   . CKD (chronic kidney disease), stage III   . Seizure disorder   . Cellulitis and abscess of foot 04/30/2014   Medications:  Scheduled:  . aspirin EC  81 mg Oral Daily  .  carvedilol  25 mg Oral BID WC  . ceFEPime (MAXIPIME) IV  1 g Intravenous Q12H  . cloNIDine  0.1 mg Oral TID  . heparin  5,000 Units Subcutaneous 3 times per day  . hydrALAZINE  50 mg Oral TID  . hydrochlorothiazide  12.5 mg Oral Daily  . Influenza vac split quadrivalent PF  0.5 mL Intramuscular Tomorrow-1000  . levETIRAcetam  1,000 mg Oral BID  . lisinopril  10 mg Oral Daily  . nicotine  21 mg Transdermal Daily  . pantoprazole  40 mg Oral Daily  . pneumococcal 23 valent vaccine  0.5 mL Intramuscular Tomorrow-1000  . senna  1 tablet Oral BID  . sertraline  50 mg Oral Daily  . silver sulfADIAZINE   Topical Daily  . sodium chloride  3 mL Intravenous Q12H  . vancomycin  750 mg Intravenous Q24H   Assessment: 53 yo diabetic male admitted from podiatrist office with worsening cellulitis/abscess of right foot.  Xray negative for osteomyelitis.   He has low grade fever on admission.   Labs noted.  SCr elevated.     Vancomycin 11/2>> Cefepime 11/2>>  Goal of Therapy:  Vancomycin trough level 15-20 mcg/ml  Plan:  Cefepime 1gm IV q12hrs Vancomycin 1gm IV x1 then 750mg  IV q24hrs Check trough at steady state Monitor labs, renal fxn, and cultures  [name], [place] Frieden A 08/11/2014,11:43 AM

## 2014-08-11 NOTE — Progress Notes (Signed)
CBG taken again by pt using his own glucometer - 291

## 2014-08-11 NOTE — Consult Note (Addendum)
Podiatry Consult Note  Reason for Consultation:  Infected diabetic ulceration of the right foot  History of Present Illness: William Fuentes is a 53 y.o. male admitted yesterday after presenting to my office for a scheduled follow-up visit.  An Oasis graft was applied to a diabetic foot ulceration.  He left the dressing clean, dry and intact as instructed.  He was not feeling well and had been experiencing chills.  He had experienced periods of nausea with no emesis.  His blood glucose readings had been around 350.  He was admitted to the hospitalist service for IV antibiotic therapy and medical management.  Currently, he is not feeling well and continues to experience some chills.  He denies any current nausea.  He denies any chest pain or shortness of air.  He denies any calf tenderness.   Past Medical History  Diagnosis Date  . Diabetes mellitus with neuropathy     and nephropathy  . Hypertension   . CKD (chronic kidney disease), stage III   . Seizure disorder   . Cellulitis and abscess of foot 04/30/2014   Scheduled Meds: . aspirin EC  81 mg Oral Daily  . carvedilol  25 mg Oral BID WC  . cloNIDine  0.1 mg Oral TID  . heparin  5,000 Units Subcutaneous 3 times per day  . hydrALAZINE  50 mg Oral TID  . hydrochlorothiazide  12.5 mg Oral Daily  . Influenza vac split quadrivalent PF  0.5 mL Intramuscular Tomorrow-1000  . levETIRAcetam  1,000 mg Oral BID  . lisinopril  10 mg Oral Daily  . nicotine  21 mg Transdermal Daily  . pantoprazole  40 mg Oral Daily  . pneumococcal 23 valent vaccine  0.5 mL Intramuscular Tomorrow-1000  . senna  1 tablet Oral BID  . sertraline  50 mg Oral Daily  . silver sulfADIAZINE   Topical Daily  . sodium chloride  3 mL Intravenous Q12H   Continuous Infusions:  PRN Meds:.sodium chloride, acetaminophen **OR** acetaminophen, ondansetron **OR** ondansetron (ZOFRAN) IV, polyethylene glycol, sodium chloride  Allergies  Allergen Reactions  . Zosyn [Piperacillin  Sod-Tazobactam So]     Presumed causative agent for diffuse rash   Past Surgical History  Procedure Laterality Date  . Brain surgery      2013  . 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  Marland Kitchen. Hernia repair     History reviewed. No pertinent family history.   Social History:  reports that he has been smoking.  He does not have any smokeless tobacco history on file. He reports that he does not drink alcohol. His drug history is not on file.  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  Review of Systems: Significant for malaise, chills an ulceration of both feet.  Physical Examination: Vital signs in last 24 hours:   Temp:  [98.9 F (37.2 C)-100.6 F (38.1 C)] 99.7 F (37.6 C) (11/03 16100613) Pulse Rate:  [71-84] 71 (11/03 0613) Resp:  [16-18] 18 (11/03 0613) BP: (131-139)/(59-67) 133/63 mmHg (11/03 0613) SpO2:  [94 %-98 %] 94 % (11/03 0613) Weight:  [146 lb 3.2 oz (66.316 kg)] 146 lb 3.2 oz (66.316 kg) (11/03 0852)  Left foot: A dressing is intact.  A full-thickness ulceration is present along the plantar aspect of the first metatarsal head.  The wound bed is granular with mild surrounding hyperkeratotic tissue present.  The ulceration does not probe to bone.  There are no acute signs of  infection present.  Dorsalis pedis pulse is palpable.  Posterior tibial pulses palpable.  Right foot: A dressing is intact.  A full-thickness ulceration is present along the plantar aspect of the plantar aspect of the fifth metatarsal head.  The wound bed is comprised of mixed fibrotic and granular tissue.  Purulence was expressed from the wound with palpation of the fourth interspace distally.  There is periwound erythema.  Dorsalis pedis pulse is palpable.  Posterior tibial pulses palpable.  There is edema of the foot.  Lab/Test Results:  No results for input(s): WBC, HGB, HCT, PLT, NA, K, CL, CO2, BUN, CREATININE, GLUCOSE, CALCIUM in the last 72 hours.  Recent Results  (from the past 240 hour(s))  Culture, blood (routine x 2)     Status: None (Preliminary result)   Collection Time: 08/10/14  8:56 PM  Result Value Ref Range Status   Specimen Description LEFT ANTECUBITAL  Final   Special Requests BOTTLES DRAWN AEROBIC AND ANAEROBIC 8CC EACH  Final   Culture PENDING  Incomplete   Report Status PENDING  Incomplete  Culture, blood (routine x 2)     Status: None (Preliminary result)   Collection Time: 08/10/14  8:56 PM  Result Value Ref Range Status   Specimen Description RIGHT ANTECUBITAL  Final   Special Requests BOTTLES DRAWN AEROBIC AND ANAEROBIC [facility]6CC EACH  Final   Culture PENDING  Incomplete   Report Status PENDING  Incomplete     Dg Foot Complete Right  08/10/2014   CLINICAL DATA:  Wound infection. Patient had surgery on the foot today. Has a defect in the plantar surface of the foot. Initial encounter.  EXAM: RIGHT FOOT COMPLETE - 3+ VIEW  COMPARISON:  04/30/2014.  FINDINGS: No acute fracture.  No dislocation.  Previous amputation of the right great toe. There has also been previous amputations of the second and third toes including distal portions of the second and third metatarsals. There is an old healed fracture of the base of the fourth metatarsal. These findings are stable.  There are no areas of bone resorption to suggest osteomyelitis. There is no soft tissue air. Soft tissues demonstrate stable vascular calcifications. No radiopaque foreign bodies.  IMPRESSION: 1. No fracture or acute finding. 2. No evidence of osteomyelitis. 3. No soft tissue air.   Electronically Signed   By: Amie Portlandavid  Ormond M.D.   On: 08/10/2014 21:12    Assessment: 1.  Concern for osteomyelitis of the fifth metatarsal and/or abscess of the fourth interspace, right foot   2.  Cellulitis, right foot. 3.  Diabetic ulceration, right foot. 4.  Diabetic ulceration, left foot. 5.  Diabetes mellitus with peripheral neuropathy.  Plan: The exam findings, diagnoses and treatment  options were explained to the patient.  A Betadine dressing was applied to both feet.  Labs are pending.  Blood cultures are pending.  Wound culture is pending.  I recommend an MRI to evaluate for osteomyelitis or possible abscess of the right foot.  He may require surgical debridement.  Further care will be determined based on the results of the MRI.  Continue with current IV antibiotic therapy for now.   Chaylee Ehrsam IVAN 08/11/2014, 9:00 AM

## 2014-08-11 NOTE — Progress Notes (Signed)
[facility] PROGRESS NOTE  William Fuentes WUJ:[id number] DOB: [birth year] DOA: 08/10/2014 PCP: No primary care provider on file.  Assessment/Plan:  53 y/o male with IDDM presented with R foot ulcer worsening for month; referred for admission by podiatry   1. R foot ulcer (DM), cellulitis -X ray no osteomyelitis;  -cont IV atx, awaiting Dr. Loralie Fuentes's further recommendations   2. IDDM'; patient is on insulin pump; Pt refused further management of his DM; Last A1c 8.3 04/30/14. -he wants to cont insulin pump, and manage himself  3. HTN; currently stable   -no Labs available at this time; will obtain labs -d/w patient at length; He is refusing further care by hospitalist; he says "I am here with foot problem, and I want to see only podiatrist" I discussed at length again offered further medical care he declined; He clearly understand complications related to his medical problems; will try to discuss again   Code Status: full Family Communication: d/w patient,. No family at the bedside  (indicate person spoken with, relationship, and if by phone, the number) Disposition Plan: home pend clinical improvement    Consultants:  Podiatry   Procedures:  none  Antibiotics:  vanc 11/2<<<  Cefepime 11/2>>>   (indicate start date, and stop date if known)  HPI/Subjective: alert  Objective: Filed Vitals:   08/11/14 0613  BP: 133/63  Pulse: 71  Temp: 99.7 F (37.6 C)  Resp: 18   No intake or output data in the 24 hours ending 08/11/14 0914 Filed Weights   08/11/14 0852  Weight: 66.316 kg (146 lb 3.2 oz)    Exam:   General:  alert  Cardiovascular: s1,s2 rrr  Respiratory: CTA BL  Abdomen: soft, nt,nd   Musculoskeletal: R foot dressing in place (patient refused examination to remove the dressing)  Data Reviewed: Basic Metabolic Panel: No results for input(s): NA, K, CL, CO2, GLUCOSE, BUN, CREATININE, CALCIUM, MG, PHOS in the last 168 hours. Liver Function  Tests: No results for input(s): AST, ALT, ALKPHOS, BILITOT, PROT, ALBUMIN in the last 168 hours. No results for input(s): LIPASE, AMYLASE in the last 168 hours. No results for input(s): AMMONIA in the last 168 hours. CBC: No results for input(s): WBC, NEUTROABS, HGB, HCT, MCV, PLT in the last 168 hours. Cardiac Enzymes: No results for input(s): CKTOTAL, CKMB, CKMBINDEX, TROPONINI in the last 168 hours. BNP (last 3 results) No results for input(s): PROBNP in the last 8760 hours. CBG: No results for input(s): GLUCAP in the last 168 hours.  Recent Results (from the past 240 hour(s))  Culture, blood (routine x 2)     Status: None (Preliminary result)   Collection Time: 08/10/14  8:56 PM  Result Value Ref Range Status   Specimen Description LEFT ANTECUBITAL  Final   Special Requests BOTTLES DRAWN AEROBIC AND ANAEROBIC 8CC EACH  Final   Culture PENDING  Incomplete   Report Status PENDING  Incomplete  Culture, blood (routine x 2)     Status: None (Preliminary result)   Collection Time: 08/10/14  8:56 PM  Result Value Ref Range Status   Specimen Description RIGHT ANTECUBITAL  Final   Special Requests BOTTLES DRAWN AEROBIC AND ANAEROBIC [facility]6CC EACH  Final   Culture PENDING  Incomplete   Report Status PENDING  Incomplete     Studies: Dg Foot Complete Right  08/10/2014   CLINICAL DATA:  Wound infection. Patient had surgery on the foot today. Has a defect in the plantar surface of the foot. Initial encounter.  EXAM:  RIGHT FOOT COMPLETE - 3+ VIEW  COMPARISON:  04/30/2014.  FINDINGS: No acute fracture.  No dislocation.  Previous amputation of the right great toe. There has also been previous amputations of the second and third toes including distal portions of the second and third metatarsals. There is an old healed fracture of the base of the fourth metatarsal. These findings are stable.  There are no areas of bone resorption to suggest osteomyelitis. There is no soft tissue air. Soft tissues  demonstrate stable vascular calcifications. No radiopaque foreign bodies.  IMPRESSION: 1. No fracture or acute finding. 2. No evidence of osteomyelitis. 3. No soft tissue air.   Electronically Signed   By: Amie Portlandavid  Ormond M.D.   On: 08/10/2014 21:12    Scheduled Meds: . aspirin EC  81 mg Oral Daily  . carvedilol  25 mg Oral BID WC  . cloNIDine  0.1 mg Oral TID  . heparin  5,000 Units Subcutaneous 3 times per day  . hydrALAZINE  50 mg Oral TID  . hydrochlorothiazide  12.5 mg Oral Daily  . Influenza vac split quadrivalent PF  0.5 mL Intramuscular Tomorrow-1000  . levETIRAcetam  1,000 mg Oral BID  . lisinopril  10 mg Oral Daily  . nicotine  21 mg Transdermal Daily  . pantoprazole  40 mg Oral Daily  . pneumococcal 23 valent vaccine  0.5 mL Intramuscular Tomorrow-1000  . senna  1 tablet Oral BID  . sertraline  50 mg Oral Daily  . silver sulfADIAZINE   Topical Daily  . sodium chloride  3 mL Intravenous Q12H   Continuous Infusions:   Principal Problem:   Cellulitis and abscess of foot Active Problems:   HTN (hypertension)   Diabetes mellitus with neuropathy   CKD (chronic kidney disease), stage III   Tobacco abuse   Seizure disorder   Cellulitis and abscess    Time spent: >35 minutes     William Fuentes, William Fuentes  [facility] Pager [phone]. If 7PM-7AM, please contact night-coverage at [email or web address], password [email or web address] 08/11/2014, 9:14 AM  LOS: 1 day

## 2014-08-11 NOTE — Care Management Note (Signed)
    Page 1 of 1   08/14/2014     1:33:08 PM CARE MANAGEMENT NOTE 08/14/2014  Patient:  William Fuentes,William Fuentes   Account Number:  [id number]  Date Initiated:  08/11/2014  Documentation initiated by:  Anibal HendersonBOLDEN,GENEVA  Subjective/Objective Assessment:   Admitted with diabetic foot ulcer, and concern for osteo. Pt is from home with spouse and children, and will return home at D/C.     Action/Plan:   Will follow for needs- MRI neg. for osteo, so should not need IV ABX at home   Anticipated DC Date:  08/12/2014   Anticipated DC Plan:  HOME/SELF CARE      DC Planning Services  CM consult      Choice offered to / List presented to:             Status of service:  Completed, signed off Medicare Important Message given?   (If response is "NO", the following Medicare IM given date fields will be blank) Date Medicare IM given:   Medicare IM given by:   Date Additional Medicare IM given:   Additional Medicare IM given by:    Discharge Disposition:  HOME/SELF CARE  Per UR Regulation:  Reviewed for med. necessity/level of care/duration of stay  If discussed at Long Length of Stay Meetings, dates discussed:    Comments:  08/14/14 1330 Arlyss Queenammy Kayona Foor, RN BSN CM Pt had I&D today. Do not anticipate need for IV AB. Pt stated that he could do is own dressing changes and does not want HH.  08/12/14 1300 Arlyss Queenammy Azaria Stegman, RN BSN CM [facility]. Pt stated he and his wife could do any dressing changes needed.  08/11/14 1645 Anibal HendersonGeneva Bolden RN/CM

## 2014-08-11 NOTE — Care Management Utilization Note (Signed)
UR completed 

## 2014-08-12 LAB — CBC
HCT: 31.4 % — ABNORMAL LOW (ref 39.0–52.0)
HEMOGLOBIN: 10.6 g/dL — AB (ref 13.0–17.0)
MCH: 29.9 pg (ref 26.0–34.0)
MCHC: 33.8 g/dL (ref 30.0–36.0)
MCV: 88.7 fL (ref 78.0–100.0)
Platelets: 112 10*3/uL — ABNORMAL LOW (ref 150–400)
RBC: 3.54 MIL/uL — ABNORMAL LOW (ref 4.22–5.81)
RDW: 13 % (ref 11.5–15.5)
WBC: 14.4 10*3/uL — ABNORMAL HIGH (ref 4.0–10.5)

## 2014-08-12 LAB — BASIC METABOLIC PANEL
Anion gap: 9 (ref 5–15)
BUN: 29 mg/dL — ABNORMAL HIGH (ref 6–23)
CHLORIDE: 99 meq/L (ref 96–112)
CO2: 25 mEq/L (ref 19–32)
CREATININE: 1.99 mg/dL — AB (ref 0.50–1.35)
Calcium: 9.2 mg/dL (ref 8.4–10.5)
GFR calc Af Amer: 42 mL/min — ABNORMAL LOW (ref >90)
GFR calc non Af Amer: 37 mL/min — ABNORMAL LOW (ref >90)
Glucose, Bld: 275 mg/dL — ABNORMAL HIGH (ref 70–99)
POTASSIUM: 4.8 meq/L (ref 3.7–5.3)
Sodium: 133 mEq/L — ABNORMAL LOW (ref 137–147)

## 2014-08-12 MED ORDER — INSULIN PUMP
Freq: Three times a day (TID) | SUBCUTANEOUS | Status: DC
Start: 2014-08-12 — End: 2014-08-15
  Administered 2014-08-12: 4.3 via SUBCUTANEOUS
  Administered 2014-08-12: 4.7 via SUBCUTANEOUS
  Administered 2014-08-12: 6.9 via SUBCUTANEOUS
  Administered 2014-08-13: 13:00:00 via SUBCUTANEOUS
  Administered 2014-08-13: 4.4 via SUBCUTANEOUS
  Administered 2014-08-13 (×2): via SUBCUTANEOUS
  Administered 2014-08-14: 6 via SUBCUTANEOUS
  Administered 2014-08-14: 5 via SUBCUTANEOUS
  Administered 2014-08-14: 7 via SUBCUTANEOUS
  Administered 2014-08-15: 02:00:00 via SUBCUTANEOUS
  Filled 2014-08-12: qty 1

## 2014-08-12 MED ORDER — POVIDONE-IODINE 10 % EX SOLN
CUTANEOUS | Status: AC
  Filled 2014-08-12: qty 118

## 2014-08-12 NOTE — Progress Notes (Signed)
Patient does not want to check sugar tonight. He states "I didn't eat anything." Will continue to monitor.

## 2014-08-12 NOTE — Progress Notes (Signed)
Pt checked own BS with home glucometer BS 295. Pt states he gave himself 4.3Units. Pt is still refusing to use our glucometer States " Lavenia Atlasve been doing this 23 years I know more than you do about this pump"

## 2014-08-12 NOTE — Progress Notes (Signed)
[facility] PROGRESS NOTE  William FarrierMark Fuentes ZOX:[id number] DOB: [birth year] DOA: 08/10/2014 PCP: No primary care provider on file.  Assessment/Plan:  53 y/o male with IDDM presented with R foot ulcer worsening for month; referred for admission by podiatry   1. R foot ulcer (DM), cellulitis -X ray/MRI foot does not show evidence of osteomyelitis;  -cont IV atx, awaiting Dr. Loralie ChampagneMcKinney's further recommendations   2. IDDM'; patient is on insulin pump; Pt refused further management of his DM; Last A1c 8.3 04/30/14. -he wants to cont insulin pump, and manage himself   3. HTN; currently stable   4. Chronic kidney disease stage III. Creatinine appears to be at baseline. Continue to monitor.  Code Status: full Family Communication: d/w patient,. No family at the bedside  (indicate person spoken with, relationship, and if by phone, the number) Disposition Plan: home pend clinical improvement    Consultants:  Podiatry   Procedures:  none  Antibiotics:  vanc 11/2<<<  Cefepime 11/2>>>   HPI/Subjective: Feeling better today. Feels less pain and swelling in his right foot.  Objective: Filed Vitals:   08/12/14 1500  BP: 127/67  Pulse: 72  Temp: 98.7 F (37.1 C)  Resp: 17    Intake/Output Summary (Last 24 hours) at 08/12/14 1946 Last data filed at 08/12/14 1810  Gross per 24 hour  Intake    360 ml  Output      0 ml  Net    360 ml   Filed Weights   08/11/14 0852  Weight: 66.316 kg (146 lb 3.2 oz)    Exam:   General:  alert  Cardiovascular: s1,s2 rrr  Respiratory: CTA BL  Abdomen: soft, nt,nd   Musculoskeletal: R foot dressing in place (patient refused examination to remove the dressing)  Data Reviewed: Basic Metabolic Panel:  Recent Labs Lab 08/11/14 1055 08/12/14 0523  NA 137 133*  K 4.8 4.8  CL 100 99  CO2 26 25  GLUCOSE 224* 275*  BUN 29* 29*  CREATININE 2.04* 1.99*  CALCIUM 9.0 9.2   Liver Function Tests: No results for input(s): AST, ALT,  ALKPHOS, BILITOT, PROT, ALBUMIN in the last 168 hours. No results for input(s): LIPASE, AMYLASE in the last 168 hours. No results for input(s): AMMONIA in the last 168 hours. CBC:  Recent Labs Lab 08/12/14 0523  WBC 14.4*  HGB 10.6*  HCT 31.4*  MCV 88.7  PLT 112*   Cardiac Enzymes: No results for input(s): CKTOTAL, CKMB, CKMBINDEX, TROPONINI in the last 168 hours. BNP (last 3 results) No results for input(s): PROBNP in the last 8760 hours. CBG: No results for input(s): GLUCAP in the last 168 hours.  Recent Results (from the past 240 hour(s))  Wound culture     Status: None (Preliminary result)   Collection Time: 08/10/14  7:50 PM  Result Value Ref Range Status   Specimen Description WOUND RIGHT FOOT  Final   Special Requests NONE  Final   Gram Stain   Final    NO WBC SEEN NO SQUAMOUS EPITHELIAL CELLS SEEN RARE GRAM POSITIVE COCCI IN PAIRS Performed at [facility]    Culture   Final    MODERATE GROUP B STREP(S.AGALACTIAE)ISOLATED Performed at [facility]    Report Status PENDING  Incomplete  Culture, blood (routine x 2)     Status: None (Preliminary result)   Collection Time: 08/10/14  8:56 PM  Result Value Ref Range Status   Specimen Description BLOOD LEFT ANTECUBITAL  Final   Special  Requests BOTTLES DRAWN AEROBIC AND ANAEROBIC 8CC EACH  Final   Culture NO GROWTH 2 DAYS  Final   Report Status PENDING  Incomplete  Culture, blood (routine x 2)     Status: None (Preliminary result)   Collection Time: 08/10/14  8:56 PM  Result Value Ref Range Status   Specimen Description BLOOD RIGHT ANTECUBITAL  Final   Special Requests BOTTLES DRAWN AEROBIC AND ANAEROBIC 6CC EACH  Final   Culture NO GROWTH 2 DAYS  Final   Report Status PENDING  Incomplete     Studies: Mr Foot Right Wo Contrast  08/11/2014   CLINICAL DATA:  Cellulitis and soft tissue ulcer at the plantar aspect of the fifth metatarsal head.  EXAM: MRI OF THE RIGHT FOREFOOT WITHOUT CONTRAST   TECHNIQUE: Multiplanar, multisequence MR imaging was performed. No intravenous contrast was administered.  COMPARISON:  Radiographs dated 08/10/2014 and MRI dated 04/30/2014  FINDINGS: There is no evidence of osteomyelitis. 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 There is a persistent fluid collection at the plantar and distal aspect of the head of the first metatarsal, slightly changed in configuration but overall not changed in size.  There are no joint effusions. There is no bone destruction around the fifth metatarsal head or elsewhere in the bones of the forefoot. No visible abscess.  IMPRESSION: 1. No visible osteomyelitis, joint effusion, or focal cellulitis. 2. Persistent fluid collection around the head of the first metatarsal without significant change since the prior exam. This probably represents a distended adventitial bursa or chronic seroma.   Electronically Signed   By: Geanie Cooley M.D.   On: 08/11/2014 16:22   Dg Foot Complete Right  08/10/2014   CLINICAL DATA:  Wound infection. Patient had surgery on the foot today. Has a defect in the plantar surface of the foot. Initial encounter.  EXAM: RIGHT FOOT COMPLETE - 3+ VIEW  COMPARISON:  04/30/2014.  FINDINGS: No acute fracture.  No dislocation.  Previous amputation of the right great toe. There has also been previous amputations of the second and third toes including distal portions of the second and third metatarsals. There is an old healed fracture of the base of the fourth metatarsal. These findings are stable.  There are no areas of bone resorption to suggest osteomyelitis. There is no soft tissue air. Soft tissues demonstrate stable vascular calcifications. No radiopaque foreign bodies.  IMPRESSION: 1. No fracture or acute finding. 2. No evidence of osteomyelitis. 3. No soft tissue air.   Electronically Signed   By: Amie Portland M.D.   On:  08/10/2014 21:12    Scheduled Meds: . aspirin EC  81 mg Oral Daily  . carvedilol  25 mg Oral BID WC  . ceFEPime (MAXIPIME) IV  1 g Intravenous Q12H  . cloNIDine  0.1 mg Oral TID  . heparin  5,000 Units Subcutaneous 3 times per day  . hydrALAZINE  50 mg Oral TID  . hydrochlorothiazide  12.5 mg Oral Daily  . insulin pump   Subcutaneous TID AC, HS, 0200  . levETIRAcetam  1,000 mg Oral BID  . lisinopril  10 mg Oral Daily  . nicotine  21 mg Transdermal Daily  . pantoprazole  40 mg Oral Daily  . povidone-iodine      . senna  1 tablet Oral BID  . sertraline  50 mg Oral Daily  . sodium chloride  3  mL Intravenous Q12H  . vancomycin  750 mg Intravenous Q24H   Continuous Infusions:   Principal Problem:   Cellulitis and abscess of foot Active Problems:   HTN (hypertension)   Diabetes mellitus with neuropathy   CKD (chronic kidney disease), stage III   Tobacco abuse   Seizure disorder   Cellulitis and abscess    Time spent: 30 minutes     MEMON,JEHANZEB  [facility] Pager [phone]. If 7PM-7AM, please contact night-coverage at [email or web address], password [email or web address] 08/12/2014, 7:46 PM  LOS: 2 days

## 2014-08-12 NOTE — Progress Notes (Signed)
Pt refusing to have an IV placed. Stated "I'm ready to go home and I want to talk to my doctor about the plan before I get stuck again." Dr. Kerry HoughMemon paged about possible discharge status. Will ask about switching to PO antibiotics if pt continues to refuse IV.

## 2014-08-12 NOTE — Consult Note (Signed)
Reason for Consult:F/U Right foot cellulitis with DFU Referring Physician:     [name] is an 53 y.o. male.  HPI: Patient states that he feels fine and is ready to go home now.  He is not happy with the hospital food and cannot sleep. He states that he was told the MRI showed nothing wrong with his foot and he's ready to go home.  Past Medical History  Diagnosis Date  . Diabetes mellitus with neuropathy     and nephropathy  . Hypertension   . CKD (chronic kidney disease), stage III   . Seizure disorder   . Cellulitis and abscess of foot 04/30/2014    Past Surgical History  Procedure Laterality Date  . Brain surgery      2013  . 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  Marland Kitchen Hernia repair      History reviewed. No pertinent family history.  Social History:  reports that he has been smoking.  He does not have any smokeless tobacco history on file. He reports that he does not drink alcohol. His drug history is not on file.  Allergies:  Allergies  Allergen Reactions  . Zosyn [Piperacillin Sod-Tazobactam So]     Presumed causative agent for diffuse rash    Medications:   Results for orders placed or performed during the hospital encounter of 08/10/14 (from the past 48 hour(s))  Wound culture     Status: None (Preliminary result)   Collection Time: 08/10/14  7:50 PM  Result Value Ref Range   Specimen Description WOUND RIGHT FOOT    Special Requests NONE    Gram Stain      NO WBC SEEN NO SQUAMOUS EPITHELIAL CELLS SEEN RARE GRAM POSITIVE COCCI IN PAIRS Performed at [facility] STREP(S.AGALACTIAE)ISOLATED Performed at [facility]    Report Status PENDING   Hemoglobin A1c     Status: Abnormal   Collection Time: 08/10/14  8:56 PM  Result Value Ref Range   Hgb A1c MFr Bld 7.4 (H) <5.7 %    Comment: (NOTE)                                                                       According to the ADA Clinical  Practice Recommendations for 2011, when HbA1c is used as a screening test:  >=6.5%   Diagnostic of Diabetes Mellitus           (if abnormal result is confirmed) 5.7-6.4%   Increased risk of developing Diabetes Mellitus References:Diagnosis and Classification of Diabetes Mellitus,Diabetes OBSJ,6283,66(QHUTM 1):S62-S69 and Standards of Medical Care in         Diabetes - 2011,Diabetes Care,2011,34 (Suppl 1):S11-S61.    Mean Plasma Glucose 166 (H) <117 mg/dL    Comment: Performed at [facility], blood (routine x 2)     Status: None (Preliminary result)   Collection Time: 08/10/14  8:56 PM  Result Value Ref Range   Specimen Description BLOOD LEFT ANTECUBITAL    Special Requests BOTTLES DRAWN AEROBIC AND ANAEROBIC 8CC EACH    Culture NO GROWTH 2 DAYS    Report Status PENDING  Culture, blood (routine x 2)     Status: None (Preliminary result)   Collection Time: 08/10/14  8:56 PM  Result Value Ref Range   Specimen Description BLOOD RIGHT ANTECUBITAL    Special Requests BOTTLES DRAWN AEROBIC AND ANAEROBIC 6CC EACH    Culture NO GROWTH 2 DAYS    Report Status PENDING   Basic metabolic panel     Status: Abnormal   Collection Time: 08/11/14 10:55 AM  Result Value Ref Range   Sodium 137 137 - 147 mEq/L   Potassium 4.8 3.7 - 5.3 mEq/L   Chloride 100 96 - 112 mEq/L   CO2 26 19 - 32 mEq/L   Glucose, Bld 224 (H) 70 - 99 mg/dL   BUN 29 (H) 6 - 23 mg/dL   Creatinine, Ser 2.04 (H) 0.50 - 1.35 mg/dL   Calcium 9.0 8.4 - 10.5 mg/dL   GFR calc non Af Amer 36 (L) >90 mL/min   GFR calc Af Amer 41 (L) >90 mL/min    Comment: (NOTE) The eGFR has been calculated using the CKD EPI equation. This calculation has not been validated in all clinical situations. eGFR's persistently <90 mL/min signify possible Chronic Kidney Disease.    Anion gap 11 5 - 15  Basic metabolic panel     Status: Abnormal   Collection Time: 08/12/14  5:23 AM  Result Value Ref Range   Sodium 133 (L) 137 - 147  mEq/L   Potassium 4.8 3.7 - 5.3 mEq/L   Chloride 99 96 - 112 mEq/L   CO2 25 19 - 32 mEq/L   Glucose, Bld 275 (H) 70 - 99 mg/dL   BUN 29 (H) 6 - 23 mg/dL   Creatinine, Ser 1.99 (H) 0.50 - 1.35 mg/dL   Calcium 9.2 8.4 - 10.5 mg/dL   GFR calc non Af Amer 37 (L) >90 mL/min   GFR calc Af Amer 42 (L) >90 mL/min    Comment: (NOTE) The eGFR has been calculated using the CKD EPI equation. This calculation has not been validated in all clinical situations. eGFR's persistently <90 mL/min signify possible Chronic Kidney Disease.    Anion gap 9 5 - 15  CBC     Status: Abnormal   Collection Time: 08/12/14  5:23 AM  Result Value Ref Range   WBC 14.4 (H) 4.0 - 10.5 K/uL   RBC 3.54 (L) 4.22 - 5.81 MIL/uL   Hemoglobin 10.6 (L) 13.0 - 17.0 g/dL   HCT 31.4 (L) 39.0 - 52.0 %   MCV 88.7 78.0 - 100.0 fL   MCH 29.9 26.0 - 34.0 pg   MCHC 33.8 30.0 - 36.0 g/dL   RDW 13.0 11.5 - 15.5 %   Platelets 112 (L) 150 - 400 K/uL    Comment: SPECIMEN CHECKED FOR CLOTS PLATELET COUNT CONFIRMED BY SMEAR     Mr Foot Right Wo Contrast  08/11/2014   CLINICAL DATA:  Cellulitis and soft tissue ulcer at the plantar aspect of the fifth metatarsal head.  EXAM: MRI OF THE RIGHT FOREFOOT WITHOUT CONTRAST  TECHNIQUE: Multiplanar, multisequence MR imaging was performed. No intravenous contrast was administered.  COMPARISON:  Radiographs dated 08/10/2014 and MRI dated 04/30/2014  FINDINGS: There is no evidence of osteomyelitis. 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 There is a persistent fluid collection at the plantar and distal aspect of the head of the first metatarsal, slightly changed in  configuration but overall not changed in size.  There are no joint effusions. There is no bone destruction around the fifth metatarsal head or elsewhere in the bones of the forefoot. No visible abscess.  IMPRESSION: 1. No visible  osteomyelitis, joint effusion, or focal cellulitis. 2. Persistent fluid collection around the head of the first metatarsal without significant change since the prior exam. This probably represents a distended adventitial bursa or chronic seroma.   Electronically Signed   By: Rozetta Nunnery M.D.   On: 08/11/2014 16:22   Dg Foot Complete Right  08/10/2014   CLINICAL DATA:  Wound infection. Patient had surgery on the foot today. Has a defect in the plantar surface of the foot. Initial encounter.  EXAM: RIGHT FOOT COMPLETE - 3+ VIEW  COMPARISON:  04/30/2014.  FINDINGS: No acute fracture.  No dislocation.  Previous amputation of the right great toe. There has also been previous amputations of the second and third toes including distal portions of the second and third metatarsals. There is an old healed fracture of the base of the fourth metatarsal. These findings are stable.  There are no areas of bone resorption to suggest osteomyelitis. There is no soft tissue air. Soft tissues demonstrate stable vascular calcifications. No radiopaque foreign bodies.  IMPRESSION: 1. No fracture or acute finding. 2. No evidence of osteomyelitis. 3. No soft tissue air.   Electronically Signed   By: Lajean Manes M.D.   On: 08/10/2014 21:12    ROS Blood pressure 127/67, pulse 72, temperature 98.7 F (37.1 C), temperature source Oral, resp. rate 17, height 6' 1"  (1.854 m), weight 66.316 kg (146 lb 3.2 oz), SpO2 96 %. Physical Exam  Right foot DP 1/4, PT nonpalpable.  Ulcer to 5th submetatarsal head with fibrotic wound bed. Wound measures approximately 1.5x1.8x0.2cm.  Purulent and sanguinous drainage expressed from lateral aspect of ulcer.  Skin slough and skin breakdown along dorsal 5th metatarsal head with fluctuance noted.  No malodor. No pain upon palpation.  Left foot DP 1/4, PT nonpalpable.  Ulcer to 1st submetatarsal head with intact Oasis graft and periwound hyperkeratosis.  No purulence  expressed.   Assessment/Plan: 53yo male presents with right foot cellulitis and abscess, bilateral diabetic foot ulcerations s/p Oasis graft application, Diabetic peripheral neuropathy.  Explained to patient that it is in his best interest to stay at the hospital because he needs the IV antibiotic treatment.  Patient spiked temp last night and WBC from this am was 14.4.  MRI of right foot did not reveal osteomyelitis however patient still has purulent drainage and is suspicious for abscess.  Dressed bilateral foot ulcers with betadine soaked gauze and DSD, ACE.  Patient's attitude was much improved after explaining his condition to him.  Continue Vanco and Cefepime. F/U Blood cultures.  Juanita Laster 08/12/2014, 6:29 PM

## 2014-08-12 NOTE — Progress Notes (Signed)
William Fuentes is refusing to allow nursing to check his blood sugars.  He is wearing an insulin pump and checking his BS with his home glucometer.

## 2014-08-12 NOTE — Progress Notes (Signed)
Pt states his CBG-287 and gave himself 4.1 units. States he will give himself more insulin once his breakfast arrives and report to RN the insulin dosage given.

## 2014-08-13 DIAGNOSIS — E1040 Type 1 diabetes mellitus with diabetic neuropathy, unspecified: Secondary | ICD-10-CM

## 2014-08-13 LAB — VANCOMYCIN, TROUGH: VANCOMYCIN TR: 11.9 ug/mL (ref 10.0–20.0)

## 2014-08-13 LAB — BASIC METABOLIC PANEL
ANION GAP: 12 (ref 5–15)
BUN: 24 mg/dL — ABNORMAL HIGH (ref 6–23)
CALCIUM: 9.2 mg/dL (ref 8.4–10.5)
CHLORIDE: 96 meq/L (ref 96–112)
CO2: 25 meq/L (ref 19–32)
CREATININE: 1.78 mg/dL — AB (ref 0.50–1.35)
GFR calc non Af Amer: 42 mL/min — ABNORMAL LOW (ref >90)
GFR, EST AFRICAN AMERICAN: 49 mL/min — AB (ref >90)
Glucose, Bld: 226 mg/dL — ABNORMAL HIGH (ref 70–99)
Potassium: 4.5 mEq/L (ref 3.7–5.3)
SODIUM: 133 meq/L — AB (ref 137–147)

## 2014-08-13 LAB — CBC
HCT: 32.4 % — ABNORMAL LOW (ref 39.0–52.0)
Hemoglobin: 11.3 g/dL — ABNORMAL LOW (ref 13.0–17.0)
MCH: 30.6 pg (ref 26.0–34.0)
MCHC: 34.9 g/dL (ref 30.0–36.0)
MCV: 87.8 fL (ref 78.0–100.0)
Platelets: 124 10*3/uL — ABNORMAL LOW (ref 150–400)
RBC: 3.69 MIL/uL — ABNORMAL LOW (ref 4.22–5.81)
RDW: 12.8 % (ref 11.5–15.5)
WBC: 9.3 10*3/uL (ref 4.0–10.5)

## 2014-08-13 LAB — SURGICAL PCR SCREEN
MRSA, PCR: NEGATIVE
STAPHYLOCOCCUS AUREUS: POSITIVE — AB

## 2014-08-13 NOTE — Progress Notes (Signed)
Podiatry Progress Note  Subjective: William Fuentes is a 53 y.o. male who is being followed for cellulitis secondary to infected diabetic ulceration of his right foot.  He is feeling better than admission.  He denies any nausea, vomiting, chills, chest pain, shortness of breath or calf tenderness.  I saw him this AM and expressed approximately 1 cc of purulence from an area of fluctuance lateral to the 5th metatarsal head of the right foot.     Past Medical History  Diagnosis Date  . Diabetes mellitus with neuropathy     and nephropathy  . Hypertension   . CKD (chronic kidney disease), stage III   . Seizure disorder   . Cellulitis and abscess of foot 04/30/2014   Scheduled Meds: . aspirin EC  81 mg Oral Daily  . carvedilol  25 mg Oral BID WC  . ceFEPime (MAXIPIME) IV  1 g Intravenous Q12H  . cloNIDine  0.1 mg Oral TID  . hydrALAZINE  50 mg Oral TID  . hydrochlorothiazide  12.5 mg Oral Daily  . insulin pump   Subcutaneous TID AC, HS, 0200  . levETIRAcetam  1,000 mg Oral BID  . lisinopril  10 mg Oral Daily  . nicotine  21 mg Transdermal Daily  . pantoprazole  40 mg Oral Daily  . senna  1 tablet Oral BID  . sertraline  50 mg Oral Daily  . sodium chloride  3 mL Intravenous Q12H  . vancomycin  750 mg Intravenous Q24H   Continuous Infusions:  PRN Meds:.sodium chloride, acetaminophen **OR** acetaminophen, ondansetron **OR** ondansetron (ZOFRAN) IV, polyethylene glycol, sodium chloride  Allergies  Allergen Reactions  . Zosyn [Piperacillin Sod-Tazobactam So]     Presumed causative agent for diffuse rash   Past Surgical History  Procedure Laterality Date  . Brain surgery      2013  . 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  Marland Kitchen. Hernia repair     History reviewed. No pertinent family history.   Social History:  reports that he has been smoking.  He does not have any smokeless tobacco history on file. He reports that he does not drink alcohol. His drug history is not on  file.  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  Physical Examination: Vital signs in last 24 hours:   Temp:  [98.5 F (36.9 C)-98.7 F (37.1 C)] 98.6 F (37 C) (11/05 1425) Pulse Rate:  [65-68] 68 (11/05 1425) Resp:  [18] 18 (11/05 1425) BP: (128-147)/(66-70) 135/70 mmHg (11/05 1425) SpO2:  [96 %-98 %] 97 % (11/05 1425)  Left foot: A dressing is intact.  A full-thickness ulceration is present along the plantar aspect of the first metatarsal head.  The wound bed is granular with mild surrounding hyperkeratotic tissue present.  The ulceration does not probe to bone.  There are no acute signs of infection present.  Dorsalis pedis pulse is palpable.  Posterior tibial pulses palpable.  Right foot: A dressing is intact.  A full-thickness ulceration is present along the plantar aspect of the plantar aspect of the fifth metatarsal head.  The wound bed is comprised of mixed fibrotic and granular tissue.  The overall size of the ulceration is improved from admission.  Less than 0.5 cc of purulence was expressed from the wound with palpation of the fourth interspace distally and lateral aspect of the 5th metatarsal head.  There is periwound erythema.  It is improved from admission.  Dorsalis pedis pulse  is palpable.  Posterior tibial pulses palpable.  There is edema of the foot.  Lab/Test Results:    Recent Labs  08/12/14 0523 08/13/14 0906  WBC 14.4* 9.3  HGB 10.6* 11.3*  HCT 31.4* 32.4*  PLT 112* 124*  NA 133* 133*  K 4.8 4.5  CL 99 96  CO2 25 25  BUN 29* 24*  CREATININE 1.99* 1.78*  GLUCOSE 275* 226*  CALCIUM 9.2 9.2    Recent Results (from the past 240 hour(s))  Wound culture     Status: None (Preliminary result)   Collection Time: 08/10/14  7:50 PM  Result Value Ref Range Status   Specimen Description WOUND RIGHT FOOT  Final   Special Requests NONE  Final   Gram Stain   Final    NO WBC SEEN NO SQUAMOUS EPITHELIAL CELLS SEEN RARE GRAM POSITIVE COCCI IN  PAIRS Performed at [facility]    Culture   Final    FEW STAPHYLOCOCCUS AUREUS Note: RIFAMPIN AND GENTAMICIN SHOULD NOT BE USED AS SINGLE DRUGS FOR TREATMENT OF STAPH INFECTIONS. MODERATE GROUP B STREP(S.AGALACTIAE)ISOLATED Note: TESTING AGAINST S. AGALACTIAE NOT ROUTINELY PERFORMED DUE TO PREDICTABILITY OF AMP/PEN/VAN SUSCEPTIBILITY. Performed at [facility]    Report Status PENDING  Incomplete  Culture, blood (routine x 2)     Status: None (Preliminary result)   Collection Time: 08/10/14  8:56 PM  Result Value Ref Range Status   Specimen Description BLOOD LEFT ANTECUBITAL  Final   Special Requests BOTTLES DRAWN AEROBIC AND ANAEROBIC 8CC EACH  Final   Culture NO GROWTH 3 DAYS  Final   Report Status PENDING  Incomplete  Culture, blood (routine x 2)     Status: None (Preliminary result)   Collection Time: 08/10/14  8:56 PM  Result Value Ref Range Status   Specimen Description BLOOD RIGHT ANTECUBITAL  Final   Special Requests BOTTLES DRAWN AEROBIC AND ANAEROBIC 6CC EACH  Final   Culture NO GROWTH 3 DAYS  Final   Report Status PENDING  Incomplete     No results found.  Assessment: 1.  Concern for abscess of the fourth interspace, right foot   2.  Cellulitis, right foot. 3.  Diabetic ulceration, right foot. 4.  Diabetic ulceration, left foot. 5.  Diabetes mellitus with peripheral neuropathy.  Plan: Given the persistent erythema and purulence, I recommend incision and drainage of his right foot tomorrow AM.  The benefits and risks of the procedure were explained to the patient.  No guarantees have been given.  A written consent will be obtained.  Surgery is scheduled for 07:30 AM.  He has been placed NPO and heparin discontinued.     Abygale Karpf IVAN 08/13/2014, 5:59 PM

## 2014-08-13 NOTE — Progress Notes (Signed)
[facility] PROGRESS NOTE  William FarrierMark Fuentes XBJ:[id number] DOB: [birth year] DOA: 08/10/2014 PCP: No primary care provider on file.  Assessment/Plan:  53 y/o male with IDDM presented with R foot ulcer worsening for month; referred for admission by podiatry   1. R foot ulcer (DM), cellulitis -X ray/MRI foot does not show evidence of osteomyelitis;  -cont IV atx, awaiting Dr. Loralie ChampagneMcKinney's further recommendations   2. IDDM; patient is on insulin pump; Pt refused further management of his DM; Last A1c 8.3 on 04/30/14. -he wants to cont insulin pump, and manage himself. Reports blood sugars are in the 180 range today.   3. HTN; currently stable   4. Chronic kidney disease stage III. Creatinine appears to be at baseline. Continue to monitor.  Code Status: full Family Communication: d/w patient,. No family at the bedside  Disposition Plan: home pend clinical improvement    Consultants:  Podiatry   Procedures:  none  Antibiotics:  vanc 11/2<<<  Cefepime 11/2>>>   HPI/Subjective: Feels better today. Has not had any chills. No shortness of breath. Appetite is good.  Objective: Filed Vitals:   08/13/14 0538  BP: 147/66  Pulse: 65  Temp: 98.7 F (37.1 C)  Resp: 18    Intake/Output Summary (Last 24 hours) at 08/13/14 1554 Last data filed at 08/13/14 0935  Gross per 24 hour  Intake    543 ml  Output      0 ml  Net    543 ml   Filed Weights   08/11/14 0852  Weight: 66.316 kg (146 lb 3.2 oz)    Exam:   General:  alert  Cardiovascular: s1,s2 rrr  Respiratory: CTA BL  Abdomen: soft, nt,nd   Musculoskeletal: R foot dressing in place, this was not removed  Data Reviewed: Basic Metabolic Panel:  Recent Labs Lab 08/11/14 1055 08/12/14 0523 08/13/14 0906  NA 137 133* 133*  K 4.8 4.8 4.5  CL 100 99 96  CO2 26 25 25   GLUCOSE 224* 275* 226*  BUN 29* 29* 24*  CREATININE 2.04* 1.99* 1.78*  CALCIUM 9.0 9.2 9.2   Liver Function Tests: No results for  input(s): AST, ALT, ALKPHOS, BILITOT, PROT, ALBUMIN in the last 168 hours. No results for input(s): LIPASE, AMYLASE in the last 168 hours. No results for input(s): AMMONIA in the last 168 hours. CBC:  Recent Labs Lab 08/12/14 0523 08/13/14 0906  WBC 14.4* 9.3  HGB 10.6* 11.3*  HCT 31.4* 32.4*  MCV 88.7 87.8  PLT 112* 124*   Cardiac Enzymes: No results for input(s): CKTOTAL, CKMB, CKMBINDEX, TROPONINI in the last 168 hours. BNP (last 3 results) No results for input(s): PROBNP in the last 8760 hours. CBG: No results for input(s): GLUCAP in the last 168 hours.  Recent Results (from the past 240 hour(s))  Wound culture     Status: None (Preliminary result)   Collection Time: 08/10/14  7:50 PM  Result Value Ref Range Status   Specimen Description WOUND RIGHT FOOT  Final   Special Requests NONE  Final   Gram Stain   Final    NO WBC SEEN NO SQUAMOUS EPITHELIAL CELLS SEEN RARE GRAM POSITIVE COCCI IN PAIRS Performed at [facility]    Culture   Final    FEW STAPHYLOCOCCUS AUREUS Note: RIFAMPIN AND GENTAMICIN SHOULD NOT BE USED AS SINGLE DRUGS FOR TREATMENT OF STAPH INFECTIONS. MODERATE GROUP B STREP(S.AGALACTIAE)ISOLATED Note: TESTING AGAINST S. AGALACTIAE NOT ROUTINELY PERFORMED DUE TO PREDICTABILITY OF AMP/PEN/VAN SUSCEPTIBILITY. Performed at [facility]    Report Status PENDING  Incomplete  Culture, blood (routine x 2)     Status: None (Preliminary result)   Collection Time: 08/10/14  8:56 PM  Result Value Ref Range Status   Specimen Description BLOOD LEFT ANTECUBITAL  Final   Special Requests BOTTLES DRAWN AEROBIC AND ANAEROBIC 8CC EACH  Final   Culture NO GROWTH 3 DAYS  Final   Report Status PENDING  Incomplete  Culture, blood (routine x 2)     Status: None (Preliminary result)   Collection Time: 08/10/14  8:56 PM  Result Value Ref Range Status   Specimen Description BLOOD RIGHT ANTECUBITAL  Final   Special Requests BOTTLES DRAWN AEROBIC AND  ANAEROBIC 6CC EACH  Final   Culture NO GROWTH 3 DAYS  Final   Report Status PENDING  Incomplete     Studies: Mr Foot Right Wo Contrast  08/11/2014   CLINICAL DATA:  Cellulitis and soft tissue ulcer at the plantar aspect of the fifth metatarsal head.  EXAM: MRI OF THE RIGHT FOREFOOT WITHOUT CONTRAST  TECHNIQUE: Multiplanar, multisequence MR imaging was performed. No intravenous contrast was administered.  COMPARISON:  Radiographs dated 08/10/2014 and MRI dated 04/30/2014  FINDINGS: There is no evidence of osteomyelitis. 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 There is a persistent fluid collection at the plantar and distal aspect of the head of the first metatarsal, slightly changed in configuration but overall not changed in size.  There are no joint effusions. There is no bone destruction around the fifth metatarsal head or elsewhere in the bones of the forefoot. No visible abscess.  IMPRESSION: 1. No visible osteomyelitis, joint effusion, or focal cellulitis. 2. Persistent fluid collection around the head of the first metatarsal without significant change since the prior exam. This probably represents a distended adventitial bursa or chronic seroma.   Electronically Signed   By: Geanie CooleyJim  Fuentes M.D.   On: 08/11/2014 16:22    Scheduled Meds: . aspirin EC  81 mg Oral Daily  . carvedilol  25 mg Oral BID WC  . ceFEPime (MAXIPIME) IV  1 g Intravenous Q12H  . cloNIDine  0.1 mg Oral TID  . heparin  5,000 Units Subcutaneous 3 times per day  . hydrALAZINE  50 mg Oral TID  . hydrochlorothiazide  12.5 mg Oral Daily  . insulin pump   Subcutaneous TID AC, HS, 0200  . levETIRAcetam  1,000 mg Oral BID  . lisinopril  10 mg Oral Daily  . nicotine  21 mg Transdermal Daily  . pantoprazole  40 mg Oral Daily  . senna  1 tablet Oral BID  . sertraline  50 mg Oral Daily  . sodium chloride  3 mL Intravenous Q12H  .  vancomycin  750 mg Intravenous Q24H   Continuous Infusions:   Principal Problem:   Cellulitis and abscess of foot Active Problems:   HTN (hypertension)   Diabetes mellitus with neuropathy   CKD (chronic kidney disease), stage III   Tobacco abuse   Seizure disorder   Cellulitis and abscess    Time spent: 30 minutes     MEMON,JEHANZEB  [facility] Pager [phone]. If 7PM-7AM, please contact night-coverage at [email or web address], password [email or web address] 08/13/2014, 3:54 PM  LOS: 3 days

## 2014-08-13 NOTE — Plan of Care (Signed)
Problem: Discharge Progression Outcomes Goal: Tolerating diet Outcome: Completed/Met Date Met:  08/13/14     

## 2014-08-14 ENCOUNTER — Inpatient Hospital Stay (HOSPITAL_COMMUNITY): Payer: 59 | Admitting: Anesthesiology

## 2014-08-14 ENCOUNTER — Encounter (HOSPITAL_COMMUNITY)
Admission: EM | Disposition: A | Discharge status: Home / Self Care | Payer: Self-pay | Source: Ambulatory Visit | Attending: Internal Medicine

## 2014-08-14 ENCOUNTER — Encounter (HOSPITAL_COMMUNITY): Payer: Self-pay

## 2014-08-14 HISTORY — PX: INCISION AND DRAINAGE: SHX5863

## 2014-08-14 HISTORY — PX: WOUND DEBRIDEMENT: SHX247

## 2014-08-14 LAB — GLUCOSE, CAPILLARY
GLUCOSE-CAPILLARY: 297 mg/dL — AB (ref 70–99)
GLUCOSE-CAPILLARY: 330 mg/dL — AB (ref 70–99)
Glucose-Capillary: 275 mg/dL — ABNORMAL HIGH (ref 70–99)
Glucose-Capillary: 336 mg/dL — ABNORMAL HIGH (ref 70–99)
Glucose-Capillary: 270 mg/dL — ABNORMAL HIGH (ref 70–99)
Glucose-Capillary: 279 mg/dL — ABNORMAL HIGH (ref 70–99)

## 2014-08-14 LAB — WOUND CULTURE: GRAM STAIN: NONE SEEN

## 2014-08-14 LAB — BASIC METABOLIC PANEL
ANION GAP: 12 (ref 5–15)
BUN: 25 mg/dL — ABNORMAL HIGH (ref 6–23)
CO2: 27 mEq/L (ref 19–32)
Calcium: 9.6 mg/dL (ref 8.4–10.5)
Chloride: 98 mEq/L (ref 96–112)
Creatinine, Ser: 1.79 mg/dL — ABNORMAL HIGH (ref 0.50–1.35)
GFR calc non Af Amer: 42 mL/min — ABNORMAL LOW (ref >90)
GFR, EST AFRICAN AMERICAN: 48 mL/min — AB (ref >90)
Glucose, Bld: 292 mg/dL — ABNORMAL HIGH (ref 70–99)
Potassium: 4.8 mEq/L (ref 3.7–5.3)
Sodium: 137 mEq/L (ref 137–147)

## 2014-08-14 LAB — CBC
HCT: 32.9 % — ABNORMAL LOW (ref 39.0–52.0)
Hemoglobin: 11.5 g/dL — ABNORMAL LOW (ref 13.0–17.0)
MCH: 30.5 pg (ref 26.0–34.0)
MCHC: 35 g/dL (ref 30.0–36.0)
MCV: 87.3 fL (ref 78.0–100.0)
Platelets: 143 10*3/uL — ABNORMAL LOW (ref 150–400)
RBC: 3.77 MIL/uL — ABNORMAL LOW (ref 4.22–5.81)
RDW: 12.8 % (ref 11.5–15.5)
WBC: 7 10*3/uL (ref 4.0–10.5)

## 2014-08-14 SURGERY — INCISION AND DRAINAGE
Anesthesia: Monitor Anesthesia Care | Site: Foot | Laterality: Right

## 2014-08-14 MED ORDER — LACTATED RINGERS IV SOLN
INTRAVENOUS | Status: DC | PRN
  Administered 2014-08-14: 07:00:00 via INTRAVENOUS

## 2014-08-14 MED ORDER — MIDAZOLAM HCL 5 MG/5ML IJ SOLN
INTRAMUSCULAR | Status: DC | PRN
  Administered 2014-08-14 (×2): 1 mg via INTRAVENOUS

## 2014-08-14 MED ORDER — LIDOCAINE HCL (PF) 1 % IJ SOLN
INTRAMUSCULAR | Status: AC
  Filled 2014-08-14: qty 5

## 2014-08-14 MED ORDER — MIDAZOLAM HCL 2 MG/2ML IJ SOLN
INTRAMUSCULAR | Status: AC
  Filled 2014-08-14: qty 2

## 2014-08-14 MED ORDER — PROPOFOL 10 MG/ML IV BOLUS
INTRAVENOUS | Status: AC
  Filled 2014-08-14: qty 20

## 2014-08-14 MED ORDER — SODIUM CHLORIDE 0.9 % IJ SOLN
INTRAMUSCULAR | Status: AC
  Filled 2014-08-14: qty 10

## 2014-08-14 MED ORDER — FENTANYL CITRATE 0.05 MG/ML IJ SOLN
INTRAMUSCULAR | Status: DC | PRN
  Administered 2014-08-14 (×4): 25 ug via INTRAVENOUS

## 2014-08-14 MED ORDER — EPHEDRINE SULFATE 50 MG/ML IJ SOLN
INTRAMUSCULAR | Status: AC
  Filled 2014-08-14: qty 1

## 2014-08-14 MED ORDER — FENTANYL CITRATE 0.05 MG/ML IJ SOLN
INTRAMUSCULAR | Status: AC
  Filled 2014-08-14: qty 2

## 2014-08-14 MED ORDER — LACTATED RINGERS IV SOLN
INTRAVENOUS | Status: DC
  Administered 2014-08-14: 07:00:00 via INTRAVENOUS

## 2014-08-14 MED ORDER — BUPIVACAINE HCL (PF) 0.5 % IJ SOLN
INTRAMUSCULAR | Status: DC | PRN
  Administered 2014-08-14: 20 mL

## 2014-08-14 MED ORDER — PROPOFOL INFUSION 10 MG/ML OPTIME
INTRAVENOUS | Status: DC | PRN
  Administered 2014-08-14: 50 ug/kg/min via INTRAVENOUS

## 2014-08-14 MED ORDER — VANCOMYCIN HCL IN DEXTROSE 1-5 GM/200ML-% IV SOLN
1000.0000 mg | INTRAVENOUS | Status: DC
  Administered 2014-08-14: 1000 mg via INTRAVENOUS
  Filled 2014-08-14 (×3): qty 200

## 2014-08-14 MED ORDER — 0.9 % SODIUM CHLORIDE (POUR BTL) OPTIME
TOPICAL | Status: DC | PRN
  Administered 2014-08-14: 1000 mL

## 2014-08-14 MED ORDER — ONDANSETRON HCL 4 MG/2ML IJ SOLN: 4.0000 mg | Freq: Once | INTRAMUSCULAR | Status: DC | PRN

## 2014-08-14 MED ORDER — SUCCINYLCHOLINE CHLORIDE 20 MG/ML IJ SOLN
INTRAMUSCULAR | Status: AC
  Filled 2014-08-14: qty 1

## 2014-08-14 MED ORDER — BUPIVACAINE HCL (PF) 0.5 % IJ SOLN
INTRAMUSCULAR | Status: AC
  Filled 2014-08-14: qty 30

## 2014-08-14 MED ORDER — MIDAZOLAM HCL 2 MG/2ML IJ SOLN
1.0000 mg | INTRAMUSCULAR | Status: DC | PRN
  Administered 2014-08-14: 2 mg via INTRAVENOUS

## 2014-08-14 MED ORDER — HYDROCODONE-ACETAMINOPHEN 5-325 MG PO TABS
1.0000 | ORAL_TABLET | ORAL | Status: DC | PRN
Start: 2014-08-14 — End: 2014-08-15

## 2014-08-14 MED ORDER — FENTANYL CITRATE 0.05 MG/ML IJ SOLN: 25.0000 ug | INTRAMUSCULAR | Status: DC | PRN

## 2014-08-14 MED ORDER — LIDOCAINE HCL (CARDIAC) 10 MG/ML IV SOLN
INTRAVENOUS | Status: DC | PRN
  Administered 2014-08-14: 50 mg via INTRAVENOUS

## 2014-08-14 MED ORDER — NEOSTIGMINE METHYLSULFATE 10 MG/10ML IV SOLN
INTRAVENOUS | Status: AC
  Filled 2014-08-14: qty 1

## 2014-08-14 MED ORDER — GLYCOPYRROLATE 0.2 MG/ML IJ SOLN
INTRAMUSCULAR | Status: AC
  Filled 2014-08-14: qty 3

## 2014-08-14 MED ORDER — FENTANYL CITRATE 0.05 MG/ML IJ SOLN
25.0000 ug | INTRAMUSCULAR | Status: AC
  Administered 2014-08-14 (×2): 25 ug via INTRAVENOUS

## 2014-08-14 SURGICAL SUPPLY — 51 items
BAG HAMPER (MISCELLANEOUS) ×3 IMPLANT
BANDAGE ELASTIC 4 VELCRO NS (GAUZE/BANDAGES/DRESSINGS) ×6 IMPLANT
BANDAGE ESMARK 4X12 BL STRL LF (DISPOSABLE) ×1 IMPLANT
BANDAGE GAUZE ELAST BULKY 4 IN (GAUZE/BANDAGES/DRESSINGS) ×6 IMPLANT
BENZOIN TINCTURE PRP APPL 2/3 (GAUZE/BANDAGES/DRESSINGS) IMPLANT
BLADE 15 SAFETY STRL DISP (BLADE) IMPLANT
BLADE SURG 15 STRL LF DISP TIS (BLADE) ×2 IMPLANT
BLADE SURG 15 STRL SS (BLADE) ×4
BNDG ESMARK 4X12 BLUE STRL LF (DISPOSABLE) ×3
BNDG GAUZE ELAST 4 BULKY (GAUZE/BANDAGES/DRESSINGS) ×6 IMPLANT
CANNULA VESSEL 3MM 2 BLNT TIP (CANNULA) ×3 IMPLANT
CHLORAPREP W/TINT 26ML (MISCELLANEOUS) IMPLANT
CLOTH BEACON ORANGE TIMEOUT ST (SAFETY) ×3 IMPLANT
COVER LIGHT HANDLE STERIS (MISCELLANEOUS) ×6 IMPLANT
CUFF TOURN SGL LL 12 (TOURNIQUET CUFF) IMPLANT
CUFF TOURNIQUET SINGLE 18IN (TOURNIQUET CUFF) ×3 IMPLANT
CUFF TOURNIQUET SINGLE 24IN (TOURNIQUET CUFF) IMPLANT
DECANTER SPIKE VIAL GLASS SM (MISCELLANEOUS) ×3 IMPLANT
DRAPE OEC MINIVIEW 54X84 (DRAPES) ×3 IMPLANT
DRSG ADAPTIC 3X8 NADH LF (GAUZE/BANDAGES/DRESSINGS) ×9 IMPLANT
DURA STEPPER LG (CAST SUPPLIES) IMPLANT
DURA STEPPER MED (CAST SUPPLIES) IMPLANT
DURA STEPPER SML (CAST SUPPLIES) IMPLANT
DURA STEPPER XL (SOFTGOODS) IMPLANT
ELECT REM PT RETURN 9FT ADLT (ELECTROSURGICAL) ×3
ELECTRODE REM PT RTRN 9FT ADLT (ELECTROSURGICAL) ×1 IMPLANT
GAUZE IODOFORM PACK 1/2 7832 (GAUZE/BANDAGES/DRESSINGS) ×3 IMPLANT
GAUZE SPONGE 4X4 12PLY STRL (GAUZE/BANDAGES/DRESSINGS) ×6 IMPLANT
GLOVE BIO SURGEON STRL SZ7.5 (GLOVE) ×3 IMPLANT
GLOVE BIOGEL PI IND STRL 7.0 (GLOVE) ×1 IMPLANT
GLOVE BIOGEL PI INDICATOR 7.0 (GLOVE) ×2
GLOVE ECLIPSE 6.5 STRL STRAW (GLOVE) ×3 IMPLANT
GLOVE EXAM NITRILE MD LF STRL (GLOVE) ×3 IMPLANT
GOWN STRL REUS W/TWL LRG LVL3 (GOWN DISPOSABLE) ×6 IMPLANT
KIT ROOM TURNOVER APOR (KITS) ×3 IMPLANT
MARKER SKIN DUAL TIP RULER LAB (MISCELLANEOUS) ×3 IMPLANT
NEEDLE HYPO 27GX1-1/4 (NEEDLE) ×3 IMPLANT
NS IRRIG 1000ML POUR BTL (IV SOLUTION) ×3 IMPLANT
PACK BASIC LIMB (CUSTOM PROCEDURE TRAY) ×3 IMPLANT
PAD ABD 5X9 TENDERSORB (GAUZE/BANDAGES/DRESSINGS) ×3 IMPLANT
PAD ARMBOARD 7.5X6 YLW CONV (MISCELLANEOUS) ×3 IMPLANT
SET BASIN LINEN APH (SET/KITS/TRAYS/PACK) ×3 IMPLANT
SPONGE GAUZE 4X4 12PLY (GAUZE/BANDAGES/DRESSINGS) ×4 IMPLANT
SPONGE LAP 18X18 X RAY DECT (DISPOSABLE) ×3 IMPLANT
SUT MON AB 2-0 CT1 36 (SUTURE) IMPLANT
SUT VIC AB 4-0 PS2 27 (SUTURE) IMPLANT
SUT VICRYL AB 3-0 FS1 BRD 27IN (SUTURE) IMPLANT
SWAB CULTURE LIQ STUART DBL (MISCELLANEOUS) ×3 IMPLANT
SYR CONTROL 10ML LL (SYRINGE) ×6 IMPLANT
SYRINGE CONTROL L 12CC (SYRINGE) ×3 IMPLANT
TUBE ANAEROBIC PORT A CUL  W/M (MISCELLANEOUS) ×3 IMPLANT

## 2014-08-14 NOTE — Progress Notes (Signed)
[facility] PROGRESS NOTE  William FarrierMark Fuentes [id number] DOB: 11/17/1960 DOA: 08/10/2014 PCP: No primary care provider on file.  Assessment/Plan:  53 y/o male with IDDM presented with R foot ulcer worsening for month; referred for admission by podiatry   1. R foot ulcer (DM), cellulitis -X ray/MRI foot does not show evidence of osteomyelitis;  -cont IV atx -Status post incision and drainage of abscess on right foot as well as excisional debridement of ulceration on right foot.   2. IDDM; patient is on insulin pump; Pt refused further management of his DM; Last A1c 8.3 on 04/30/14. -he wants to cont insulin pump, and manage himself. Blood sugars are running higher today.patient wishes to adjust his pump.   3. HTN; currently stable   4. Chronic kidney disease stage III. Creatinine appears to be at baseline. Continue to monitor.  Code Status: full Family Communication: d/w patient,. No family at the bedside  Disposition Plan: home pend clinical improvement    Consultants:  Podiatry   Procedures:  11/6: Incision and drainage of abscess on right foot as well as excisional debridement right foot ulcer  Antibiotics:  vanc 11/2<<<  Cefepime 11/2>>>   HPI/Subjective: Patient seen in room postoperatively. Denies any significant pain.  Objective: Filed Vitals:   08/14/14 1409  BP: 113/58  Pulse: 67  Temp: 98 F (36.7 C)  Resp: 18    Intake/Output Summary (Last 24 hours) at 08/14/14 1923 Last data filed at 08/14/14 1200  Gross per 24 hour  Intake    940 ml  Output    275 ml  Net    665 ml   Filed Weights   08/11/14 0852 08/14/14 0651  Weight: 66.316 kg (146 lb 3.2 oz) 64.864 kg (143 lb)    Exam:   General:  alert  Cardiovascular: s1,s2 rrr  Respiratory: CTA BL  Abdomen: soft, nt,nd   Musculoskeletal: R foot dressing in place, this was not removed  Data Reviewed: Basic Metabolic Panel:  Recent Labs Lab 08/11/14 1055 08/12/14 0523 08/13/14 0906  08/14/14 0536  NA 137 133* 133* 137  K 4.8 4.8 4.5 4.8  CL 100 99 96 98  CO2 26 25 25 27   GLUCOSE 224* 275* 226* 292*  BUN 29* 29* 24* 25*  CREATININE 2.04* 1.99* 1.78* 1.79*  CALCIUM 9.0 9.2 9.2 9.6   Liver Function Tests: No results for input(s): AST, ALT, ALKPHOS, BILITOT, PROT, ALBUMIN in the last 168 hours. No results for input(s): LIPASE, AMYLASE in the last 168 hours. No results for input(s): AMMONIA in the last 168 hours. CBC:  Recent Labs Lab 08/12/14 0523 08/13/14 0906 08/14/14 0536  WBC 14.4* 9.3 7.0  HGB 10.6* 11.3* 11.5*  HCT 31.4* 32.4* 32.9*  MCV 88.7 87.8 87.3  PLT 112* 124* 143*   Cardiac Enzymes: No results for input(s): CKTOTAL, CKMB, CKMBINDEX, TROPONINI in the last 168 hours. BNP (last 3 results) No results for input(s): PROBNP in the last 8760 hours. CBG:  Recent Labs Lab 08/14/14 0651 08/14/14 0839 08/14/14 1119 08/14/14 1715 08/14/14 1850  GLUCAP 275* 297* 330* 336* 270*    Recent Results (from the past 240 hour(s))  Wound culture     Status: None   Collection Time: 08/10/14  7:50 PM  Result Value Ref Range Status   Specimen Description WOUND RIGHT FOOT  Final   Special Requests NONE  Final   Gram Stain   Final    NO WBC SEEN NO SQUAMOUS EPITHELIAL CELLS SEEN RARE GRAM POSITIVE  COCCI IN PAIRS Performed at [facility]    Culture   Final    FEW STAPHYLOCOCCUS AUREUS Note: RIFAMPIN AND GENTAMICIN SHOULD NOT BE USED AS SINGLE DRUGS FOR TREATMENT OF STAPH INFECTIONS. This organism DOES NOT demonstrate inducible Clindamycin resistance in vitro. MODERATE GROUP B STREP(S.AGALACTIAE)ISOLATED Note: TESTING AGAINST S. AGALACTIAE NOT ROUTINELY PERFORMED DUE TO PREDICTABILITY OF AMP/PEN/VAN SUSCEPTIBILITY. Performed at [facility]    Report Status 08/14/2014 FINAL  Final   Organism ID, Bacteria STAPHYLOCOCCUS AUREUS  Final      Susceptibility   Staphylococcus aureus - MIC*    CLINDAMYCIN <=0.25 SENSITIVE Sensitive      ERYTHROMYCIN >=8 RESISTANT Resistant     GENTAMICIN <=0.5 SENSITIVE Sensitive     LEVOFLOXACIN 4 INTERMEDIATE Intermediate     OXACILLIN <=0.25 SENSITIVE Sensitive     PENICILLIN >=0.5 RESISTANT Resistant     RIFAMPIN <=0.5 SENSITIVE Sensitive     TRIMETH/SULFA <=10 SENSITIVE Sensitive     VANCOMYCIN 1 SENSITIVE Sensitive     TETRACYCLINE <=1 SENSITIVE Sensitive     MOXIFLOXACIN 2 RESISTANT Resistant     * FEW STAPHYLOCOCCUS AUREUS  Culture, blood (routine x 2)     Status: None (Preliminary result)   Collection Time: 08/10/14  8:56 PM  Result Value Ref Range Status   Specimen Description BLOOD LEFT ANTECUBITAL  Final   Special Requests BOTTLES DRAWN AEROBIC AND ANAEROBIC 8CC EACH  Final   Culture NO GROWTH 3 DAYS  Final   Report Status PENDING  Incomplete  Culture, blood (routine x 2)     Status: None (Preliminary result)   Collection Time: 08/10/14  8:56 PM  Result Value Ref Range Status   Specimen Description BLOOD RIGHT ANTECUBITAL  Final   Special Requests BOTTLES DRAWN AEROBIC AND ANAEROBIC 6CC EACH  Final   Culture NO GROWTH 3 DAYS  Final   Report Status PENDING  Incomplete  Surgical pcr screen     Status: Abnormal   Collection Time: 08/13/14  6:18 PM  Result Value Ref Range Status   MRSA, PCR NEGATIVE NEGATIVE Final   Staphylococcus aureus POSITIVE (A) NEGATIVE Final    Comment:        The Xpert SA Assay (FDA approved for NASAL specimens in patients over 76 years of age), is one component of a comprehensive surveillance program.  Test performance has been validated by [employer] for patients greater than or equal to 54 year old. It is not intended to diagnose infection nor to guide or monitor treatment. RESULT CALLED TO, READ BACK BY AND VERIFIED WITH: HEARN J AT 0849 ON [id number] BY FORSYTH K      Studies: No results found.  Scheduled Meds: . aspirin EC  81 mg Oral Daily  . carvedilol  25 mg Oral BID WC  . ceFEPime (MAXIPIME) IV  1 g Intravenous Q12H  .  cloNIDine  0.1 mg Oral TID  . hydrALAZINE  50 mg Oral TID  . hydrochlorothiazide  12.5 mg Oral Daily  . insulin pump   Subcutaneous TID AC, HS, 0200  . levETIRAcetam  1,000 mg Oral BID  . lisinopril  10 mg Oral Daily  . nicotine  21 mg Transdermal Daily  . pantoprazole  40 mg Oral Daily  . senna  1 tablet Oral BID  . sertraline  50 mg Oral Daily  . sodium chloride  3 mL Intravenous Q12H  . vancomycin  1,000 mg Intravenous Q24H   Continuous Infusions:   Principal Problem:  Cellulitis and abscess of foot Active Problems:   HTN (hypertension)   Diabetes mellitus with neuropathy   CKD (chronic kidney disease), stage III   Tobacco abuse   Seizure disorder   Cellulitis and abscess    Time spent: 30 minutes     MEMON,JEHANZEB  [facility] Pager [phone]. If 7PM-7AM, please contact night-coverage at [email or web address], password [email or web address] 08/14/2014, 7:23 PM  LOS: 4 days

## 2014-08-14 NOTE — Transfer of Care (Signed)
Immediate Anesthesia Transfer of Care Note  Patient: William FarrierMark Argueta  Procedure(s) Performed: Procedure(s): INCISION AND DRAINAGE RIGHT FOOT (Right)  Patient Location: PACU  Anesthesia Type:MAC  Level of Consciousness: awake, alert , oriented and patient cooperative  Airway & Oxygen Therapy: Patient Spontanous Breathing and Patient connected to nasal cannula oxygen  Post-op Assessment: Report given to PACU RN and Post -op Vital signs reviewed and stable  Post vital signs: Reviewed and stable  Complications: No apparent anesthesia complications

## 2014-08-14 NOTE — Anesthesia Postprocedure Evaluation (Signed)
  Anesthesia Post-op Note  Patient: William Fuentes  Procedure(s) Performed: Procedure(s): INCISION AND DRAINAGE OF ABSCESS RIGHT FOOT (Right) EXCISIONAL DEBRIDEMENT OF ULCERATION RIGHT FOOT (Right)  Patient Location: PACU  Anesthesia Type:MAC  Level of Consciousness: awake, alert , oriented and patient cooperative  Airway and Oxygen Therapy: Patient Spontanous Breathing  Post-op Pain: mild  Post-op Assessment: Post-op Vital signs reviewed, Patient's Cardiovascular Status Stable, Respiratory Function Stable, Patent Airway, No signs of Nausea or vomiting and Pain level controlled  Post-op Vital Signs: Reviewed and stable  Last Vitals:  Filed Vitals:   08/14/14 0903  BP: 137/63  Pulse: 68  Temp: 36.6 C  Resp: 16    Complications: No apparent anesthesia complications

## 2014-08-14 NOTE — Op Note (Signed)
OPERATIVE NOTE  DATE OF PROCEDURE:  08/14/2014  SURGEON:   William Fuentes, DPM  OR STAFF:   Circulator: Cyndie Chimeanya Jarrell Smith, RN Scrub Person: Jari SportsmanMaggie Potter Henderson, CST   PREOPERATIVE DIAGNOSIS:   1.  Abscess of the 4th intermetatarsal space, right foot 2.  Cellulitis, right foot 3.  Diabetic ulceration, right foot  POSTOPERATIVE DIAGNOSIS: Same  PROCEDURE: 1.  Incision and drainage of abscess, right foot 2.  Excisional debridement of ulceration, right foot  ANESTHESIA:  Monitor Anesthesia Care   HEMOSTASIS:   None  ESTIMATED BLOOD LOSS:   Minimal (<5 cc)  MATERIALS USED:  Iodoform half-inch packing gauze  INJECTABLES: Marcaine 0.5% plain; 20mL  PATHOLOGY:   1.  Aerobic culture 2.  Anaerobic culture  COMPLICATIONS:   None  INDICATIONS:  Cellulitis of the right foot secondary to infected ulceration of the right foot with purulence.  DESCRIPTION OF THE PROCEDURE:   The patient was brought to the operating room and placed on the operative table in the supine position.  A pneumatic ankle tourniquet was applied to the patient's ankle.  However, the tourniquet was not inflated for the procedure.  Following sedation, the surgical site was anesthetized with 0.5% Marcaine plain.  The foot was then prepped, scrubbed, and draped in the usual sterile technique.    Attention was directed to the plantar aspect of the right foot where a full-thickness ulceration was encountered.  Surrounding hyperkeratotic tissue noted.  The wound bed was comprised of fixed fibrotic and granular tissue.  Using a #15 blade, a full thickness excisional debridement of the ulceration was performed the surrounding hyperkeratotic tissue was removed.  The fibrous tissue was removed from the wound bed.  Attention was directed to the dorsal aspect of the fourth intermetatarsal space.  A linear longitudinal incision was made.  Dissection was continued deep with him a stack.  A pocket of purulence  was encountered and expressed.  Aerobic and anaerobic cultures of the fourth intermetatarsal space were obtained. The wound was irrigated with copious amounts of sterile irrigant.  The wound was packed with half-inch iodoform gauze.  A sterile dressing was applied to the right foot.   The patient tolerated the procedure well.  The patient was then transferred to PACU with vital signs stable and vascular status intact to all toes of the operative foot.  Following a period of postoperative monitoring, the patient will be transferred back to his room.

## 2014-08-14 NOTE — Progress Notes (Signed)
ANTIBIOTIC CONSULT NOTE - follow up  Pharmacy Consult for Cefepime & Vancomycin Indication: wound infection  Allergies  Allergen Reactions  . Zosyn [Piperacillin Sod-Tazobactam So]     Presumed causative agent for diffuse rash   Patient Measurements: Height: 6\' 1"  (185.4 cm) Weight: 143 lb (64.864 kg) IBW/kg (Calculated) : 79.9  Vital Signs: Temp: 97.8 F (36.6 C) (11/06 0903) Temp Source: Oral (11/06 0651) BP: 137/63 mmHg (11/06 0903) Pulse Rate: 68 (11/06 0903) Intake/Output from previous day: 11/05 0701 - 11/06 0700 In: 443 [P.O.:240; I.V.:3; IV Piggyback:200] Out: -  Intake/Output from this shift: Total I/O In: 700 [I.V.:700] Out: 275 [Urine:275]  Labs:  Recent Labs  08/12/14 0523 08/13/14 0906 08/14/14 0536  WBC 14.4* 9.3 7.0  HGB 10.6* 11.3* 11.5*  PLT 112* 124* 143*  CREATININE 1.99* 1.78* 1.79*   Estimated Creatinine Clearance: 43.8 mL/min (by C-G formula based on Cr of 1.79).  Recent Labs  08/13/14 1632  VANCOTROUGH 11.9    Microbiology: Recent Results (from the past 720 hour(s))  Wound culture     Status: None   Collection Time: 08/10/14  7:50 PM  Result Value Ref Range Status   Specimen Description WOUND RIGHT FOOT  Final   Special Requests NONE  Final   Gram Stain   Final    NO WBC SEEN NO SQUAMOUS EPITHELIAL CELLS SEEN RARE GRAM POSITIVE COCCI IN PAIRS Performed at [facility]    Culture   Final    FEW STAPHYLOCOCCUS AUREUS Note: RIFAMPIN AND GENTAMICIN SHOULD NOT BE USED AS SINGLE DRUGS FOR TREATMENT OF STAPH INFECTIONS. This organism DOES NOT demonstrate inducible Clindamycin resistance in vitro. MODERATE GROUP B STREP(S.AGALACTIAE)ISOLATED Note: TESTING AGAINST S. AGALACTIAE NOT ROUTINELY PERFORMED DUE TO PREDICTABILITY OF AMP/PEN/VAN SUSCEPTIBILITY. Performed at [facility]    Report Status 08/14/2014 FINAL  Final   Organism ID, Bacteria STAPHYLOCOCCUS AUREUS  Final      Susceptibility   Staphylococcus aureus  - MIC*    CLINDAMYCIN <=0.25 SENSITIVE Sensitive     ERYTHROMYCIN >=8 RESISTANT Resistant     GENTAMICIN <=0.5 SENSITIVE Sensitive     LEVOFLOXACIN 4 INTERMEDIATE Intermediate     OXACILLIN <=0.25 SENSITIVE Sensitive     PENICILLIN >=0.5 RESISTANT Resistant     RIFAMPIN <=0.5 SENSITIVE Sensitive     TRIMETH/SULFA <=10 SENSITIVE Sensitive     VANCOMYCIN 1 SENSITIVE Sensitive     TETRACYCLINE <=1 SENSITIVE Sensitive     MOXIFLOXACIN 2 RESISTANT Resistant     * FEW STAPHYLOCOCCUS AUREUS  Culture, blood (routine x 2)     Status: None (Preliminary result)   Collection Time: 08/10/14  8:56 PM  Result Value Ref Range Status   Specimen Description BLOOD LEFT ANTECUBITAL  Final   Special Requests BOTTLES DRAWN AEROBIC AND ANAEROBIC 8CC EACH  Final   Culture NO GROWTH 3 DAYS  Final   Report Status PENDING  Incomplete  Culture, blood (routine x 2)     Status: None (Preliminary result)   Collection Time: 08/10/14  8:56 PM  Result Value Ref Range Status   Specimen Description BLOOD RIGHT ANTECUBITAL  Final   Special Requests BOTTLES DRAWN AEROBIC AND ANAEROBIC 6CC EACH  Final   Culture NO GROWTH 3 DAYS  Final   Report Status PENDING  Incomplete  Surgical pcr screen     Status: Abnormal   Collection Time: 08/13/14  6:18 PM  Result Value Ref Range Status   MRSA, PCR NEGATIVE NEGATIVE Final   Staphylococcus aureus  POSITIVE (A) NEGATIVE Final    Comment:        The Xpert SA Assay (FDA approved for NASAL specimens in patients over 53 years of age), is one component of a comprehensive surveillance program.  Test performance has been validated by [facility] for patients greater than or equal to 53 year old. It is not intended to diagnose infection nor to guide or monitor treatment. RESULT CALLED TO, READ BACK BY AND VERIFIED WITH: HEARN J AT 0849 ON 110515 BY FORSYTH K    Medical History: Past Medical History  Diagnosis Date  . Diabetes mellitus with neuropathy     and nephropathy   . Hypertension   . CKD (chronic kidney disease), stage III   . Seizure disorder   . Cellulitis and abscess of foot 04/30/2014  . Seizures     3 years since last seizure, had 1 seizure and on meds now- with Brain surgery.   Medications:  Scheduled:  . aspirin EC  81 mg Oral Daily  . carvedilol  25 mg Oral BID WC  . ceFEPime (MAXIPIME) IV  1 g Intravenous Q12H  . cloNIDine  0.1 mg Oral TID  . hydrALAZINE  50 mg Oral TID  . hydrochlorothiazide  12.5 mg Oral Daily  . insulin pump   Subcutaneous TID AC, HS, 0200  . levETIRAcetam  1,000 mg Oral BID  . lisinopril  10 mg Oral Daily  . nicotine  21 mg Transdermal Daily  . pantoprazole  40 mg Oral Daily  . senna  1 tablet Oral BID  . sertraline  50 mg Oral Daily  . sodium chloride  3 mL Intravenous Q12H  . vancomycin  750 mg Intravenous Q24H   Assessment: 53 yo diabetic male admitted from podiatrist office with worsening cellulitis/abscess of right foot.  Xray negative for osteomyelitis.   Pt is currently afebrile.  Labs noted.  SCr elevated but stable.   Vancomycin 11/2>> Cefepime 11/2>>  Goal of Therapy:  Vancomycin trough level 15-20 mcg/ml  Plan:   Cefepime 1gm IV q12hrs  Increase Vancomycin to 1gm IV q24hrs  Check trough level weekly or sooner if indicated.  Monitor labs, renal fxn, and cultures  Duration of therapy per podiatry.    Margo AyeHall, Jorrell Kuster A 08/14/2014,11:23 AM

## 2014-08-14 NOTE — Anesthesia Preprocedure Evaluation (Signed)
Anesthesia Evaluation  Patient identified by MRN, date of birth, ID band Patient awake    Reviewed: Allergy & Precautions, H&P , NPO status , Patient's Chart, lab work & pertinent test results, reviewed documented beta blocker date and time   Airway Mallampati: III  TM Distance: >3 FB     Dental  (+) Poor Dentition, Edentulous Upper, Partial Lower, Chipped, Dental Advisory Given   Pulmonary Current Smoker,  breath sounds clear to auscultation        Cardiovascular hypertension, Pt. on medications and Pt. on home beta blockers Rhythm:Regular Rate:Normal     Neuro/Psych Seizures -,   Neuromuscular disease (DM neuropathy)    GI/Hepatic GERD-  Medicated and Controlled,  Endo/Other  diabetes, Type 2, Oral Hypoglycemic Agents  Renal/GU Renal InsufficiencyRenal disease     Musculoskeletal   Abdominal   Peds  Hematology  (+) anemia ,   Anesthesia Other Findings   Reproductive/Obstetrics                             Anesthesia Physical Anesthesia Plan  ASA: III  Anesthesia Plan: MAC   Post-op Pain Management:    Induction: Intravenous  Airway Management Planned: Simple Face Mask  Additional Equipment:   Intra-op Plan:   Post-operative Plan:   Informed Consent: I have reviewed the patients History and Physical, chart, labs and discussed the procedure including the risks, benefits and alternatives for the proposed anesthesia with the patient or authorized representative who has indicated his/her understanding and acceptance.     Plan Discussed with:   Anesthesia Plan Comments:         Anesthesia Quick Evaluation

## 2014-08-14 NOTE — Anesthesia Procedure Notes (Signed)
Procedure Name: MAC Date/Time: 08/14/2014 7:30 AM Performed by: Pernell DupreADAMS, Kadesia Robel A Pre-anesthesia Checklist: Patient identified, Timeout performed, Emergency Drugs available, Suction available and Patient being monitored Oxygen Delivery Method: Simple face mask

## 2014-08-15 LAB — GLUCOSE, CAPILLARY
GLUCOSE-CAPILLARY: 297 mg/dL — AB (ref 70–99)
Glucose-Capillary: 191 mg/dL — ABNORMAL HIGH (ref 70–99)

## 2014-08-15 MED ORDER — DOXYCYCLINE HYCLATE 100 MG PO CAPS: 100.0000 mg | ORAL_CAPSULE | Freq: Two times a day (BID) | ORAL | Status: AC

## 2014-08-15 MED ORDER — HYDROCODONE-ACETAMINOPHEN 5-325 MG PO TABS: 1.0000 | ORAL_TABLET | Freq: Four times a day (QID) | ORAL | Status: AC | PRN

## 2014-08-15 NOTE — Progress Notes (Signed)
Dr. Nolen MuMcKinney in see patient dressings changed to left and right foot

## 2014-08-15 NOTE — Progress Notes (Signed)
Discharge instruction reviewed with patient. No distress noted. IVs removed. Dressing intact to right and left foot. Patient's wife present at bedside.

## 2014-08-15 NOTE — Progress Notes (Signed)
ANTIBIOTIC CONSULT NOTE - follow up  Pharmacy Consult for Cefepime & Vancomycin Indication: wound infection  Allergies  Allergen Reactions  . Zosyn [Piperacillin Sod-Tazobactam So]     Presumed causative agent for diffuse rash   Patient Measurements: Height: 6\' 1"  (185.4 cm) Weight: 143 lb (64.864 kg) IBW/kg (Calculated) : 79.9  Vital Signs: Temp: 98.4 F (36.9 C) (11/07 0704) Temp Source: Oral (11/07 0704) BP: 141/70 mmHg (11/07 0704) Pulse Rate: 65 (11/07 0704) Intake/Output from previous day: 11/06 0701 - 11/07 0700 In: 1383 [P.O.:680; I.V.:703] Out: 275 [Urine:275] Intake/Output from this shift:    Labs:  Recent Labs  08/13/14 0906 08/14/14 0536  WBC 9.3 7.0  HGB 11.3* 11.5*  PLT 124* 143*  CREATININE 1.78* 1.79*   Estimated Creatinine Clearance: 43.8 mL/min (by C-G formula based on Cr of 1.79).  Recent Labs  08/13/14 1632  VANCOTROUGH 11.9    Microbiology: Recent Results (from the past 720 hour(s))  Wound culture     Status: None   Collection Time: 08/10/14  7:50 PM  Result Value Ref Range Status   Specimen Description WOUND RIGHT FOOT  Final   Special Requests NONE  Final   Gram Stain   Final    NO WBC SEEN NO SQUAMOUS EPITHELIAL CELLS SEEN RARE GRAM POSITIVE COCCI IN PAIRS Performed at [facility]    Culture   Final    FEW STAPHYLOCOCCUS AUREUS Note: RIFAMPIN AND GENTAMICIN SHOULD NOT BE USED AS SINGLE DRUGS FOR TREATMENT OF STAPH INFECTIONS. This organism DOES NOT demonstrate inducible Clindamycin resistance in vitro. MODERATE GROUP B STREP(S.AGALACTIAE)ISOLATED Note: TESTING AGAINST S. AGALACTIAE NOT ROUTINELY PERFORMED DUE TO PREDICTABILITY OF AMP/PEN/VAN SUSCEPTIBILITY. Performed at [facility]    Report Status 08/14/2014 FINAL  Final   Organism ID, Bacteria STAPHYLOCOCCUS AUREUS  Final      Susceptibility   Staphylococcus aureus - MIC*    CLINDAMYCIN <=0.25 SENSITIVE Sensitive     ERYTHROMYCIN >=8 RESISTANT  Resistant     GENTAMICIN <=0.5 SENSITIVE Sensitive     LEVOFLOXACIN 4 INTERMEDIATE Intermediate     OXACILLIN <=0.25 SENSITIVE Sensitive     PENICILLIN >=0.5 RESISTANT Resistant     RIFAMPIN <=0.5 SENSITIVE Sensitive     TRIMETH/SULFA <=10 SENSITIVE Sensitive     VANCOMYCIN 1 SENSITIVE Sensitive     TETRACYCLINE <=1 SENSITIVE Sensitive     MOXIFLOXACIN 2 RESISTANT Resistant     * FEW STAPHYLOCOCCUS AUREUS  Culture, blood (routine x 2)     Status: None (Preliminary result)   Collection Time: 08/10/14  8:56 PM  Result Value Ref Range Status   Specimen Description BLOOD LEFT ANTECUBITAL  Final   Special Requests BOTTLES DRAWN AEROBIC AND ANAEROBIC 8CC EACH  Final   Culture NO GROWTH 3 DAYS  Final   Report Status PENDING  Incomplete  Culture, blood (routine x 2)     Status: None (Preliminary result)   Collection Time: 08/10/14  8:56 PM  Result Value Ref Range Status   Specimen Description BLOOD RIGHT ANTECUBITAL  Final   Special Requests BOTTLES DRAWN AEROBIC AND ANAEROBIC 6CC EACH  Final   Culture NO GROWTH 3 DAYS  Final   Report Status PENDING  Incomplete  Surgical pcr screen     Status: Abnormal   Collection Time: 08/13/14  6:18 PM  Result Value Ref Range Status   MRSA, PCR NEGATIVE NEGATIVE Final   Staphylococcus aureus POSITIVE (A) NEGATIVE Final    Comment:  The Xpert SA Assay (FDA approved for NASAL specimens in patients over 53 years of age), is one component of a comprehensive surveillance program.  Test performance has been validated by [facility] for patients greater than or equal to 53 year old. It is not intended to diagnose infection nor to guide or monitor treatment. RESULT CALLED TO, READ BACK BY AND VERIFIED WITH: HEARN J AT 0849 ON [id number] BY FORSYTH K   Anaerobic culture     Status: None (Preliminary result)   Collection Time: 08/14/14  8:10 AM  Result Value Ref Range Status   Specimen Description FOOT  Final   Special Requests NONE  Final    Gram Stain   Final    RARE WBC PRESENT, PREDOMINANTLY MONONUCLEAR NO SQUAMOUS EPITHELIAL CELLS SEEN NO ORGANISMS SEEN Performed at [facility]    Culture   Final    NO ANAEROBES ISOLATED; CULTURE IN PROGRESS FOR 5 DAYS Performed at [facility]    Report Status PENDING  Incomplete  Culture, routine-abscess     Status: None (Preliminary result)   Collection Time: 08/14/14  8:10 AM  Result Value Ref Range Status   Specimen Description FOOT RIGHT  Final   Special Requests VANCOMYCIN 750MG   Final   Gram Stain   Final    RARE WBC PRESENT, PREDOMINANTLY MONONUCLEAR NO SQUAMOUS EPITHELIAL CELLS SEEN NO ORGANISMS SEEN Performed at [facility]    Culture   Final    NO GROWTH 1 DAY Performed at [facility]    Report Status PENDING  Incomplete   Medical History: Past Medical History  Diagnosis Date  . Diabetes mellitus with neuropathy     and nephropathy  . Hypertension   . CKD (chronic kidney disease), stage III   . Seizure disorder   . Cellulitis and abscess of foot 04/30/2014  . Seizures     3 years since last seizure, had 1 seizure and on meds now- with Brain surgery.   Medications:  Scheduled:  . aspirin EC  81 mg Oral Daily  . carvedilol  25 mg Oral BID WC  . ceFEPime (MAXIPIME) IV  1 g Intravenous Q12H  . cloNIDine  0.1 mg Oral TID  . hydrALAZINE  50 mg Oral TID  . hydrochlorothiazide  12.5 mg Oral Daily  . insulin pump   Subcutaneous TID AC, HS, 0200  . levETIRAcetam  1,000 mg Oral BID  . lisinopril  10 mg Oral Daily  . nicotine  21 mg Transdermal Daily  . pantoprazole  40 mg Oral Daily  . senna  1 tablet Oral BID  . sertraline  50 mg Oral Daily  . sodium chloride  3 mL Intravenous Q12H  . vancomycin  1,000 mg Intravenous Q24H   Assessment: 53 yo diabetic male admitted from podiatrist office with worsening cellulitis/abscess of right foot.  Xray negative for osteomyelitis.   Pt is currently afebrile.  Labs noted.  SCr elevated  but stable.  Wound culture noted:  MSSA  Report Status 08/14/2014 FINAL       Organism ID, Bacteria STAPHYLOCOCCUS AUREUS    Culture & Susceptibility    STAPHYLOCOCCUS AUREUS    Antibiotic Sensitivity Microscan Status   CLINDAMYCIN Sensitive <=0.25 SENSITIVE Final   Method: MIC   ERYTHROMYCIN Resistant >=8 RESISTANT Final   Method: MIC   GENTAMICIN Sensitive <=0.5 SENSITIVE Final   Method: MIC   LEVOFLOXACIN Intermediate 4 INTERMEDIATE Final   Method: MIC   MOXIFLOXACIN Resistant 2  RESISTANT Final   Method: MIC   OXACILLIN Sensitive <=0.25 SENSITIVE Final   Method: MIC   PENICILLIN Resistant >=0.5 RESISTANT Final   Method: MIC   RIFAMPIN Sensitive <=0.5 SENSITIVE Final   Method: MIC   TETRACYCLINE Sensitive <=1 SENSITIVE Final   Method: MIC   TRIMETH/SULFA Sensitive <=10 SENSITIVE Final   Method: MIC   VANCOMYCIN Sensitive 1 SENSITIVE Final   Method: MIC   Comments STAPHYLOCOCCUS AUREUS (MIC)   FEW STAPHYLOCOCCUS AUREUS            Vancomycin 11/2>> Cefepime 11/2>>  Goal of Therapy:  Vancomycin trough level 15-20 mcg/ml  Plan:   Cefepime 1gm IV q12hrs  Vancomycin to 1gm IV q24hrs  Check trough level weekly or sooner if indicated.  Consider deescalate abx to CLINDAMYCIN (isolate sensitive)  Monitor labs, renal fxn, and cultures  Duration of therapy per podiatry.    Margo AyeHall, Janelys Glassner A 08/15/2014,10:29 AM

## 2014-08-15 NOTE — Progress Notes (Signed)
Podiatry Progress Note  Subjective: William Fuentes is a 53 y.o. male is POD 1 S/P incision and drainage of abscess of the right foot.  He denies any nausea, vomiting, fever, chills, shortness of breath or chest pain.   Past Medical History  Diagnosis Date  . Diabetes mellitus with neuropathy     and nephropathy  . Hypertension   . CKD (chronic kidney disease), stage III   . Seizure disorder   . Cellulitis and abscess of foot 04/30/2014  . Seizures     3 years since last seizure, had 1 seizure and on meds now- with Brain surgery.   Scheduled Meds: . aspirin EC  81 mg Oral Daily  . carvedilol  25 mg Oral BID WC  . ceFEPime (MAXIPIME) IV  1 g Intravenous Q12H  . cloNIDine  0.1 mg Oral TID  . hydrALAZINE  50 mg Oral TID  . hydrochlorothiazide  12.5 mg Oral Daily  . insulin pump   Subcutaneous TID AC, HS, 0200  . levETIRAcetam  1,000 mg Oral BID  . lisinopril  10 mg Oral Daily  . nicotine  21 mg Transdermal Daily  . pantoprazole  40 mg Oral Daily  . senna  1 tablet Oral BID  . sertraline  50 mg Oral Daily  . sodium chloride  3 mL Intravenous Q12H  . vancomycin  1,000 mg Intravenous Q24H   Continuous Infusions:  PRN Meds:.sodium chloride, acetaminophen **OR** acetaminophen, HYDROcodone-acetaminophen, ondansetron **OR** ondansetron (ZOFRAN) IV, polyethylene glycol, sodium chloride  Allergies  Allergen Reactions  . Zosyn [Piperacillin Sod-Tazobactam So]     Presumed causative agent for diffuse rash   Past Surgical History  Procedure Laterality Date  . Brain surgery      2013  . 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  Marland Kitchen. Hernia repair     History reviewed. No pertinent family history.   Social History:  reports that he has been smoking Cigarettes.  He has a 20 pack-year smoking history. He does not have any smokeless tobacco history on file. He reports that he does not drink alcohol or use illicit drugs.  [facility] has been [facility] at [facility] alcohol  addiction.  Physical Examination: Vital signs in last 24 hours:   Temp:  [98 F (36.7 C)-99.1 F (37.3 C)] 98.4 F (36.9 C) (11/07 0704) Pulse Rate:  [65-67] 65 (11/07 0704) Resp:  [16-18] 18 (11/07 0704) BP: (113-141)/(58-70) 124/70 mmHg (11/07 1043) SpO2:  [94 %-99 %] 94 % (11/07 0704)  Left foot: A dressing is intact.  A hyperkeratotic lesion is present along the plantar aspect of the first metatarsal head.  No open wound is visible.  There are no acute signs of infection present.  Dorsalis pedis pulse is palpable.  Posterior tibial pulses palpable.  Right foot: A dressing is intact.  A full-thickness ulceration is present along the plantar aspect of the plantar aspect of the fifth metatarsal head.  The wound bed is comprised of granular tissue.  There is a surgical incision along the dorsal aspect of the 4th interspace.  The wound is packed with iodoform gauze.  There is som persistent periwound erythema present.  It is improved.  Dorsalis pedis pulse is palpable.  Posterior tibial pulses palpable.  There is edema of the foot.  Lab/Test Results:    Recent Labs  08/13/14 0906 08/14/14 0536  WBC 9.3 7.0  HGB 11.3* 11.5*  HCT 32.4* 32.9*  PLT 124* 143*  NA 133* 137  K 4.5 4.8  CL 96 98  CO2 25 27  BUN 24* 25*  CREATININE 1.78* 1.79*  GLUCOSE 226* 292*  CALCIUM 9.2 9.6    Recent Results (from the past 240 hour(s))  Wound culture     Status: None   Collection Time: 08/10/14  7:50 PM  Result Value Ref Range Status   Specimen Description WOUND RIGHT FOOT  Final   Special Requests NONE  Final   Gram Stain   Final    NO WBC SEEN NO SQUAMOUS EPITHELIAL CELLS SEEN RARE GRAM POSITIVE COCCI IN PAIRS Performed at [facility]    Culture   Final    FEW STAPHYLOCOCCUS AUREUS Note: RIFAMPIN AND GENTAMICIN SHOULD NOT BE USED AS SINGLE DRUGS FOR TREATMENT OF STAPH INFECTIONS. This organism DOES NOT demonstrate inducible Clindamycin resistance in vitro. MODERATE GROUP B  STREP(S.AGALACTIAE)ISOLATED Note: TESTING AGAINST S. AGALACTIAE NOT ROUTINELY PERFORMED DUE TO PREDICTABILITY OF AMP/PEN/VAN SUSCEPTIBILITY. Performed at [facility]    Report Status 08/14/2014 FINAL  Final   Organism ID, Bacteria STAPHYLOCOCCUS AUREUS  Final      Susceptibility   Staphylococcus aureus - MIC*    CLINDAMYCIN <=0.25 SENSITIVE Sensitive     ERYTHROMYCIN >=8 RESISTANT Resistant     GENTAMICIN <=0.5 SENSITIVE Sensitive     LEVOFLOXACIN 4 INTERMEDIATE Intermediate     OXACILLIN <=0.25 SENSITIVE Sensitive     PENICILLIN >=0.5 RESISTANT Resistant     RIFAMPIN <=0.5 SENSITIVE Sensitive     TRIMETH/SULFA <=10 SENSITIVE Sensitive     VANCOMYCIN 1 SENSITIVE Sensitive     TETRACYCLINE <=1 SENSITIVE Sensitive     MOXIFLOXACIN 2 RESISTANT Resistant     * FEW STAPHYLOCOCCUS AUREUS  Culture, blood (routine x 2)     Status: None (Preliminary result)   Collection Time: 08/10/14  8:56 PM  Result Value Ref Range Status   Specimen Description BLOOD LEFT ANTECUBITAL  Final   Special Requests BOTTLES DRAWN AEROBIC AND ANAEROBIC 8CC EACH  Final   Culture NO GROWTH 3 DAYS  Final   Report Status PENDING  Incomplete  Culture, blood (routine x 2)     Status: None (Preliminary result)   Collection Time: 08/10/14  8:56 PM  Result Value Ref Range Status   Specimen Description BLOOD RIGHT ANTECUBITAL  Final   Special Requests BOTTLES DRAWN AEROBIC AND ANAEROBIC 6CC EACH  Final   Culture NO GROWTH 3 DAYS  Final   Report Status PENDING  Incomplete  Surgical pcr screen     Status: Abnormal   Collection Time: 08/13/14  6:18 PM  Result Value Ref Range Status   MRSA, PCR NEGATIVE NEGATIVE Final   Staphylococcus aureus POSITIVE (A) NEGATIVE Final    Comment:        The Xpert SA Assay (FDA approved for NASAL specimens in patients over 40 years of age), is one component of a comprehensive surveillance program.  Test performance has been validated by [facility] for patients  greater than or equal to 20 year old. It is not intended to diagnose infection nor to guide or monitor treatment. RESULT CALLED TO, READ BACK BY AND VERIFIED WITH: HEARN J AT 0849 ON [id number] BY FORSYTH K   Anaerobic culture     Status: None (Preliminary result)   Collection Time: 08/14/14  8:10 AM  Result Value Ref Range Status   Specimen Description FOOT  Final   Special Requests NONE  Final   Gram Stain  Final    RARE WBC PRESENT, PREDOMINANTLY MONONUCLEAR NO SQUAMOUS EPITHELIAL CELLS SEEN NO ORGANISMS SEEN Performed at [facility]    Culture   Final    NO ANAEROBES ISOLATED; CULTURE IN PROGRESS FOR 5 DAYS Performed at [facility]    Report Status PENDING  Incomplete  Culture, routine-abscess     Status: None (Preliminary result)   Collection Time: 08/14/14  8:10 AM  Result Value Ref Range Status   Specimen Description FOOT RIGHT  Final   Special Requests VANCOMYCIN 750MG   Final   Gram Stain   Final    RARE WBC PRESENT, PREDOMINANTLY MONONUCLEAR NO SQUAMOUS EPITHELIAL CELLS SEEN NO ORGANISMS SEEN Performed at [facility]    Culture   Final    NO GROWTH 1 DAY Performed at [facility]    Report Status PENDING  Incomplete     No results found.  Assessment: 1.  S/P I&D fourth interspace, right foot   2.  Cellulitis, right foot. 3.  Diabetic ulceration, right foot. 4.  Diabetic ulceration, left foot. 5.  Diabetes mellitus with peripheral neuropathy.  Plan: Packing was changed.  Dressing was applied to both feet.  He is okay for discharge from my standpoint.  He was instructed to change the packing daily.  I recommend 10 day course of doxycycline.  [place] is to follow-up in the [place] office on Monday at 1:30.   Orly Quimby IVAN 08/15/2014, 10:59 AM

## 2014-08-15 NOTE — Discharge Summary (Signed)
Physician Discharge Summary  William Fuentes ZOX:[id number] DOB: 22-Jun-1961 DOA: 08/10/2014  PCP: No primary care provider on file.  Admit date: 08/10/2014 Discharge date: 08/15/2014  Time spent: 40 minutes  Recommendations for Outpatient Follow-up:  1. Patient will follow up with Dr. Nolen Mu on 11/9 to reexamine his wound  Discharge Diagnoses:  Principal Problem:   Cellulitis and abscess of foot Active Problems:   HTN (hypertension)   Diabetes mellitus with neuropathy   CKD (chronic kidney disease), stage III   Tobacco abuse   Seizure disorder   Cellulitis and abscess   Discharge Condition: improved  Diet recommendation: low carb  Filed Weights   08/11/14 0852 08/14/14 0651  Weight: 66.316 kg (146 lb 3.2 oz) 64.864 kg (143 lb)    History of present illness:  This is a 53 year old male with history of right foot ulcer for several months prior to admission. He developed new onset redness and discharge associated with fevers and chills for 2 days prior to admission. He went to Dr. Loralie Champagne office and was directly admitted to [facility] for intravenous antibiotics.  Hospital Course:  Patient was started on vancomycin and cefepime. Imaging including x-rays and MRI did not show any underlying osteomyelitis of his right foot. He did require incision and drainage of an abscess on his right foot as well as excisional debridement of right foot ulcer debridement by Dr. Nolen Mu on 11/6. Patient tolerated this well. He did not have any further fevers and leukocytosis resolved. He is feeling significantly improved. He was transitioned to oral doxycycline to complete out his antibiotic course. He will follow-up with Dr. Nolen Mu as an outpatient to recheck his wound.  Patient was continued on insulin pump to manage his diabetes during his hospital stay.    Procedures:  11/6: Incision and drainage of abscess on right foot as well as excisional debridement right foot  ulcer  Consultations:  podiatry  Discharge Exam: Filed Vitals:   08/15/14 1043  BP: 124/70  Pulse:   Temp:   Resp:     General: NAD Cardiovascular: S1, S2 RRr Respiratory: CTA B  Discharge Instructions You were cared for by a hospitalist during your hospital stay. If you have any questions about your discharge medications or the care you received while you were in the hospital after you are discharged, you can call the unit and asked to speak with the hospitalist on call if the hospitalist that took care of you is not available. Once you are discharged, your primary care physician will handle any further medical issues. Please note that NO REFILLS for any discharge medications will be authorized once you are discharged, as it is imperative that you return to your primary care physician (or establish a relationship with a primary care physician if you do not have one) for your aftercare needs so that they can reassess your need for medications and monitor your lab values.  Discharge Instructions    Call MD for:  redness, tenderness, or signs of infection (pain, swelling, redness, odor or green/yellow discharge around incision site)    Complete by:  As directed      Call MD for:  severe uncontrolled pain    Complete by:  As directed      Call MD for:  temperature >100.4    Complete by:  As directed      Diet - low sodium heart healthy    Complete by:  As directed      Diet Carb Modified  Complete by:  As directed      Increase activity slowly    Complete by:  As directed           Discharge Medication List as of 08/15/2014 12:54 PM    START taking these medications   Details  doxycycline (VIBRAMYCIN) 100 MG capsule Take 1 capsule (100 mg total) by mouth 2 (two) times daily., Starting 08/15/2014, Until Discontinued, Print    HYDROcodone-acetaminophen (NORCO/VICODIN) 5-325 MG per tablet Take 1 tablet by mouth every 6 (six) hours as needed for moderate pain or severe pain.,  Starting 08/15/2014, Until Discontinued, Print      CONTINUE these medications which have NOT CHANGED   Details  aspirin EC 81 MG tablet Take 81 mg by mouth daily., Until Discontinued, Historical Med    carvedilol (COREG) 25 MG tablet Take 1 tablet by mouth 2 (two) times daily., Starting 04/25/2014, Until Discontinued, Historical Med    cloNIDine (CATAPRES) 0.1 MG tablet Take 1 tablet by mouth 3 (three) times daily., Starting 02/22/2014, Until Discontinued, Historical Med    hydrALAZINE (APRESOLINE) 50 MG tablet Take 1 tablet by mouth 3 (three) times daily., Starting 04/24/2014, Until Discontinued, Historical Med    hydrochlorothiazide (MICROZIDE) 12.5 MG capsule Take 1 capsule by mouth daily., Starting 04/24/2014, Until Discontinued, Historical Med    Insulin Human (INSULIN PUMP) SOLN Inject into the skin continuous. HUMALOG 100 unit/mL INSULIN, Until Discontinued, Historical Med    levETIRAcetam (KEPPRA) 1000 MG tablet Take 1 tablet by mouth 2 (two) times daily., Starting 04/27/2014, Until Discontinued, Historical Med    lisinopril (PRINIVIL,ZESTRIL) 40 MG tablet Take 0.5 tablets (20 mg total) by mouth daily., Starting 05/02/2014, Until Discontinued, No Print    magnesium oxide (MAG-OX) 400 MG tablet Take 400 mg by mouth daily., Until Discontinued, Historical Med    Multiple Vitamin (MULTIVITAMIN WITH MINERALS) TABS tablet Take 1 tablet by mouth daily., Until Discontinued, Historical Med    omeprazole (PRILOSEC OTC) 20 MG tablet Take 20 mg by mouth daily., Until Discontinued, Historical Med    sertraline (ZOLOFT) 50 MG tablet Take 1 tablet by mouth daily., Starting 04/18/2014, Until Discontinued, Historical Med    Vitamin D, Ergocalciferol, (DRISDOL) 50000 UNITS CAPS capsule Take 1 capsule by mouth every 30 (thirty) days. , Starting 04/18/2014, Until Discontinued, Historical Med      STOP taking these medications     ciprofloxacin (CIPRO) 500 MG tablet      diphenhydrAMINE (BENADRYL) 25  mg capsule        Allergies  Allergen Reactions  . Zosyn [Piperacillin Sod-Tazobactam So]     Presumed causative agent for diffuse rash      The results of significant diagnostics from this hospitalization (including imaging, microbiology, ancillary and laboratory) are listed below for reference.    Significant Diagnostic Studies: Mr Foot Right Wo Contrast  08/11/2014   CLINICAL DATA:  Cellulitis and soft tissue ulcer at the plantar aspect of the fifth metatarsal head.  EXAM: MRI OF THE RIGHT FOREFOOT WITHOUT CONTRAST  TECHNIQUE: Multiplanar, multisequence MR imaging was performed. No intravenous contrast was administered.  COMPARISON:  Radiographs dated 08/10/2014 and MRI dated 04/30/2014  FINDINGS: There is no evidence of osteomyelitis. 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 There is a persistent fluid collection at the plantar and distal aspect of the head of the first metatarsal, slightly changed in configuration but overall not changed in  size.  There are no joint effusions. There is no bone destruction around the fifth metatarsal head or elsewhere in the bones of the forefoot. No visible abscess.  IMPRESSION: 1. No visible osteomyelitis, joint effusion, or focal cellulitis. 2. Persistent fluid collection around the head of the first metatarsal without significant change since the prior exam. This probably represents a distended adventitial bursa or chronic seroma.   Electronically Signed   By: Geanie Cooley M.D.   On: 08/11/2014 16:22   Dg Foot Complete Right  08/10/2014   CLINICAL DATA:  Wound infection. Patient had surgery on the foot today. Has a defect in the plantar surface of the foot. Initial encounter.  EXAM: RIGHT FOOT COMPLETE - 3+ VIEW  COMPARISON:  04/30/2014.  FINDINGS: No acute fracture.  No dislocation.  Previous amputation of the right great toe. There has also been previous  amputations of the second and third toes including distal portions of the second and third metatarsals. There is an old healed fracture of the base of the fourth metatarsal. These findings are stable.  There are no areas of bone resorption to suggest osteomyelitis. There is no soft tissue air. Soft tissues demonstrate stable vascular calcifications. No radiopaque foreign bodies.  IMPRESSION: 1. No fracture or acute finding. 2. No evidence of osteomyelitis. 3. No soft tissue air.   Electronically Signed   By: Amie Portland M.D.   On: 08/10/2014 21:12    Microbiology: Recent Results (from the past 240 hour(s))  Wound culture     Status: None   Collection Time: 08/10/14  7:50 PM  Result Value Ref Range Status   Specimen Description WOUND RIGHT FOOT  Final   Special Requests NONE  Final   Gram Stain   Final    NO WBC SEEN NO SQUAMOUS EPITHELIAL CELLS SEEN RARE GRAM POSITIVE COCCI IN PAIRS Performed at [facility]    Culture   Final    FEW STAPHYLOCOCCUS AUREUS Note: RIFAMPIN AND GENTAMICIN SHOULD NOT BE USED AS SINGLE DRUGS FOR TREATMENT OF STAPH INFECTIONS. This organism DOES NOT demonstrate inducible Clindamycin resistance in vitro. MODERATE GROUP B STREP(S.AGALACTIAE)ISOLATED Note: TESTING AGAINST S. AGALACTIAE NOT ROUTINELY PERFORMED DUE TO PREDICTABILITY OF AMP/PEN/VAN SUSCEPTIBILITY. Performed at [facility]    Report Status 08/14/2014 FINAL  Final   Organism ID, Bacteria STAPHYLOCOCCUS AUREUS  Final      Susceptibility   Staphylococcus aureus - MIC*    CLINDAMYCIN <=0.25 SENSITIVE Sensitive     ERYTHROMYCIN >=8 RESISTANT Resistant     GENTAMICIN <=0.5 SENSITIVE Sensitive     LEVOFLOXACIN 4 INTERMEDIATE Intermediate     OXACILLIN <=0.25 SENSITIVE Sensitive     PENICILLIN >=0.5 RESISTANT Resistant     RIFAMPIN <=0.5 SENSITIVE Sensitive     TRIMETH/SULFA <=10 SENSITIVE Sensitive     VANCOMYCIN 1 SENSITIVE Sensitive     TETRACYCLINE <=1 SENSITIVE Sensitive      MOXIFLOXACIN 2 RESISTANT Resistant     * FEW STAPHYLOCOCCUS AUREUS  Culture, blood (routine x 2)     Status: None (Preliminary result)   Collection Time: 08/10/14  8:56 PM  Result Value Ref Range Status   Specimen Description BLOOD LEFT ANTECUBITAL  Final   Special Requests BOTTLES DRAWN AEROBIC AND ANAEROBIC 8CC EACH  Final   Culture NO GROWTH 3 DAYS  Final   Report Status PENDING  Incomplete  Culture, blood (routine x 2)     Status: None (Preliminary result)   Collection Time: 08/10/14  8:56 PM  Result Value Ref Range Status   Specimen Description BLOOD RIGHT ANTECUBITAL  Final   Special Requests BOTTLES DRAWN AEROBIC AND ANAEROBIC 6CC EACH  Final   Culture NO GROWTH 3 DAYS  Final   Report Status PENDING  Incomplete  Surgical pcr screen     Status: Abnormal   Collection Time: 08/13/14  6:18 PM  Result Value Ref Range Status   MRSA, PCR NEGATIVE NEGATIVE Final   Staphylococcus aureus POSITIVE (A) NEGATIVE Final    Comment:        The Xpert SA Assay (FDA approved for NASAL specimens in patients over 53 years of age), is one component of a comprehensive surveillance program.  Test performance has been validated by [facility] for patients greater than or equal to 53 year old. It is not intended to diagnose infection nor to guide or monitor treatment. RESULT CALLED TO, READ BACK BY AND VERIFIED WITH: HEARN J AT 0849 ON [id number] BY FORSYTH K   Anaerobic culture     Status: None (Preliminary result)   Collection Time: 08/14/14  8:10 AM  Result Value Ref Range Status   Specimen Description FOOT  Final   Special Requests NONE  Final   Gram Stain   Final    RARE WBC PRESENT, PREDOMINANTLY MONONUCLEAR NO SQUAMOUS EPITHELIAL CELLS SEEN NO ORGANISMS SEEN Performed at [facility]    Culture   Final    NO ANAEROBES ISOLATED; CULTURE IN PROGRESS FOR 5 DAYS Performed at [facility]    Report Status PENDING  Incomplete  Culture, routine-abscess     Status: None  (Preliminary result)   Collection Time: 08/14/14  8:10 AM  Result Value Ref Range Status   Specimen Description FOOT RIGHT  Final   Special Requests VANCOMYCIN 750MG   Final   Gram Stain   Final    RARE WBC PRESENT, PREDOMINANTLY MONONUCLEAR NO SQUAMOUS EPITHELIAL CELLS SEEN NO ORGANISMS SEEN Performed at [facility]    Culture   Final    NO GROWTH 1 DAY Performed at [facility]    Report Status PENDING  Incomplete     Labs: Basic Metabolic Panel:  Recent Labs Lab 08/11/14 1055 08/12/14 0523 08/13/14 0906 08/14/14 0536  NA 137 133* 133* 137  K 4.8 4.8 4.5 4.8  CL 100 99 96 98  CO2 26 25 25 27   GLUCOSE 224* 275* 226* 292*  BUN 29* 29* 24* 25*  CREATININE 2.04* 1.99* 1.78* 1.79*  CALCIUM 9.0 9.2 9.2 9.6   Liver Function Tests: No results for input(s): AST, ALT, ALKPHOS, BILITOT, PROT, ALBUMIN in the last 168 hours. No results for input(s): LIPASE, AMYLASE in the last 168 hours. No results for input(s): AMMONIA in the last 168 hours. CBC:  Recent Labs Lab 08/12/14 0523 08/13/14 0906 08/14/14 0536  WBC 14.4* 9.3 7.0  HGB 10.6* 11.3* 11.5*  HCT 31.4* 32.4* 32.9*  MCV 88.7 87.8 87.3  PLT 112* 124* 143*   Cardiac Enzymes: No results for input(s): CKTOTAL, CKMB, CKMBINDEX, TROPONINI in the last 168 hours. BNP: BNP (last 3 results) No results for input(s): PROBNP in the last 8760 hours. CBG:  Recent Labs Lab 08/14/14 1715 08/14/14 1850 08/14/14 2154 08/15/14 0319 08/15/14 0841  GLUCAP 336* 270* 279* 297* 191*       Signed:  MEMON,JEHANZEB  [facility] 08/15/2014, 7:34 PM

## 2014-08-15 NOTE — Progress Notes (Signed)
Patient escorted to lobby by Nurse Tech via wheelchair

## 2014-08-17 LAB — CULTURE, BLOOD (ROUTINE X 2)
Culture: NO GROWTH
Culture: NO GROWTH

## 2014-08-18 ENCOUNTER — Encounter (HOSPITAL_COMMUNITY): Payer: Self-pay | Admitting: Podiatry

## 2014-08-18 LAB — CULTURE, ROUTINE-ABSCESS: Culture: NO GROWTH

## 2014-08-18 NOTE — Care Management Utilization Note (Signed)
UR review complete.  

## 2014-08-19 LAB — ANAEROBIC CULTURE

## 2014-11-19 IMAGING — US US CAROTID DUPLEX BILAT
1 series · 13 of 24 positions shown · non-contrast
Comparison: None.

CLINICAL DATA: Left carotid bruit

EXAM:
BILATERAL CAROTID DUPLEX ULTRASOUND
TECHNIQUE: Gray scale imaging, color Doppler and duplex ultrasound were
performed of bilateral carotid and vertebral arteries in the neck.

[Series 1: us carotid duplex bilat · 0.07mm/px · 13 of 105 slices shown]
[im 1/105]
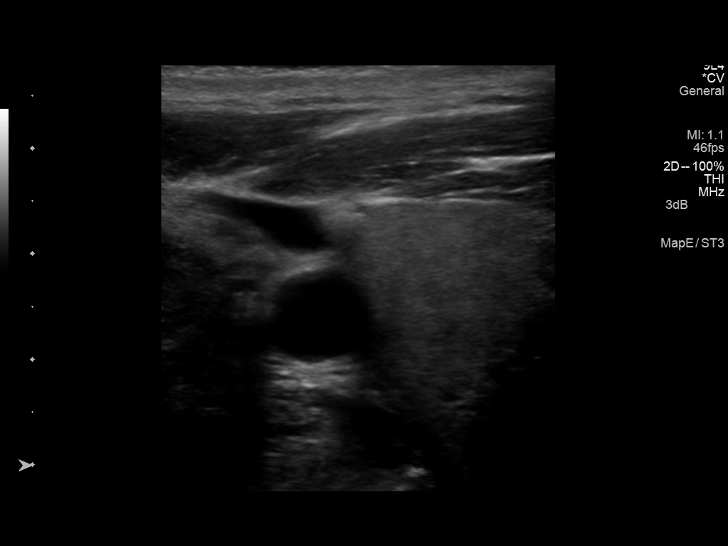
[im 10/105]
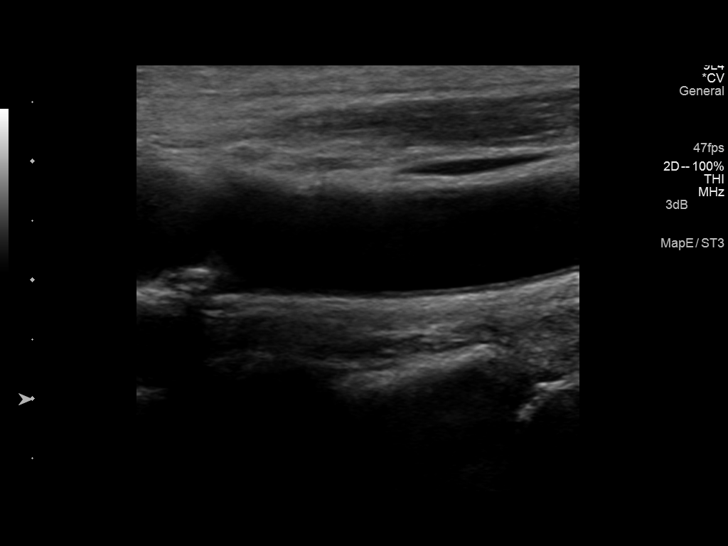
[im 19/105]
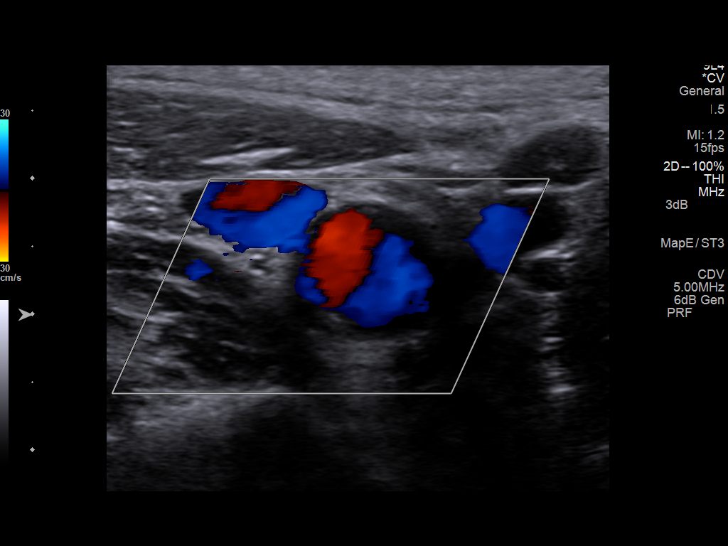
[im 28/105]
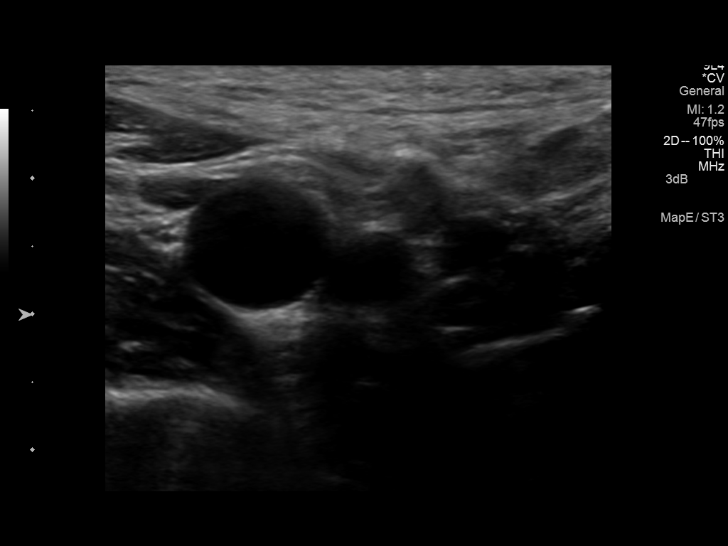
[im 37/105]
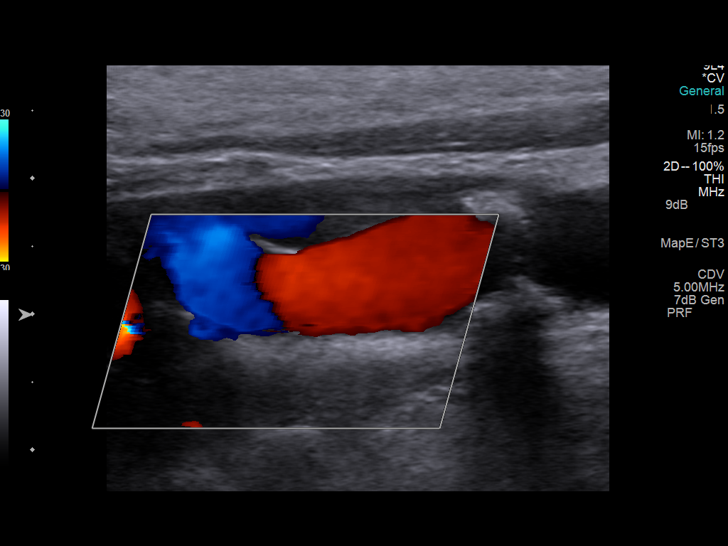
[im 46/105]
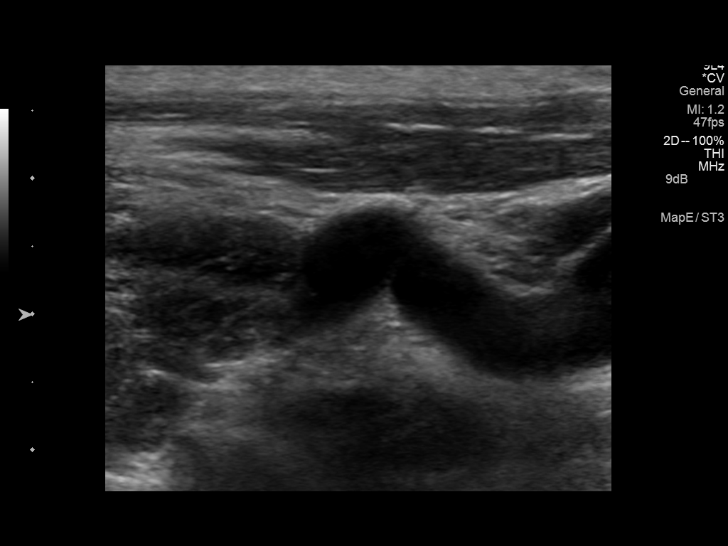
[im 55/105]
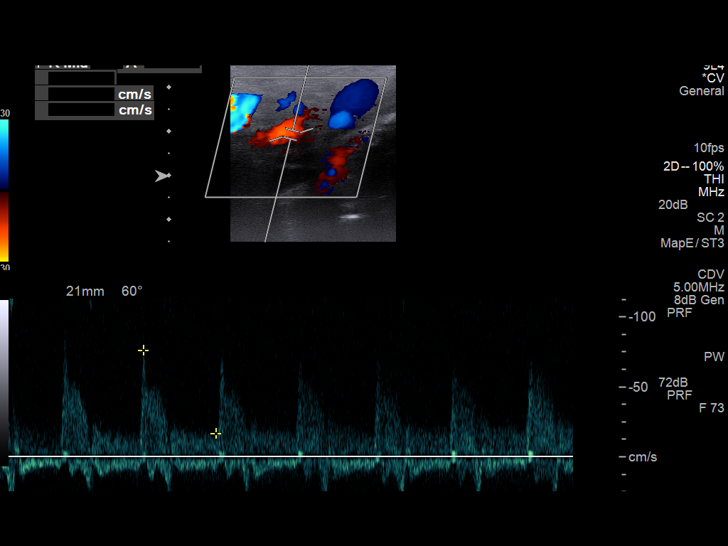
[im 59/105]
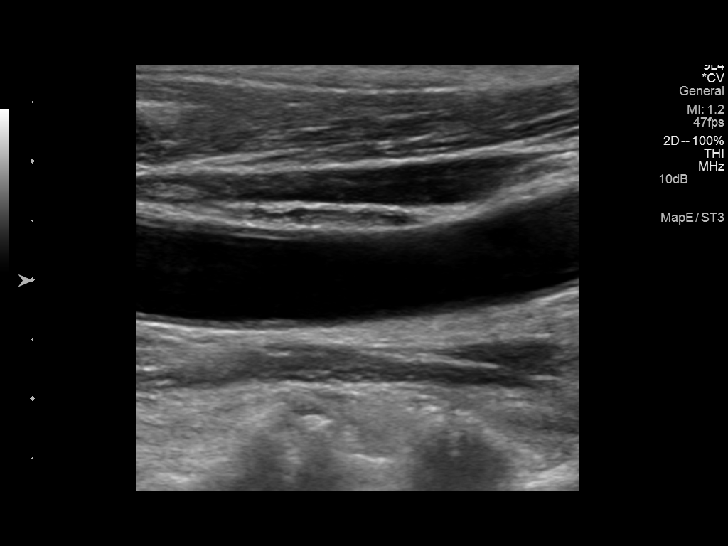
[im 68/105]
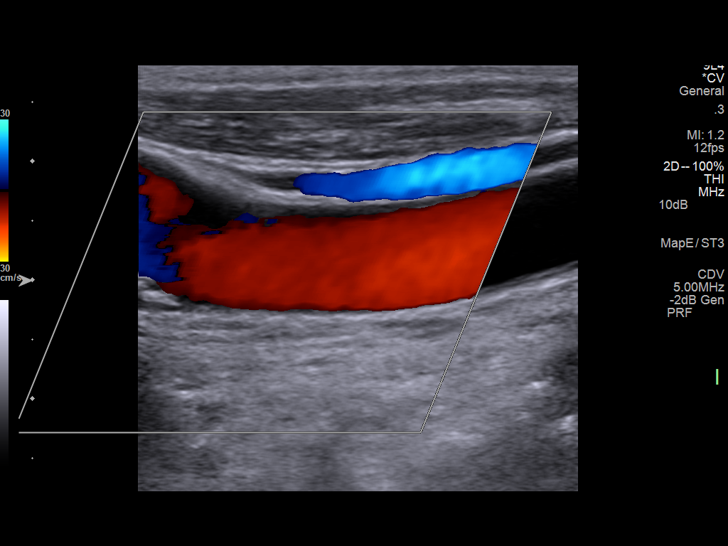
[im 77/105]
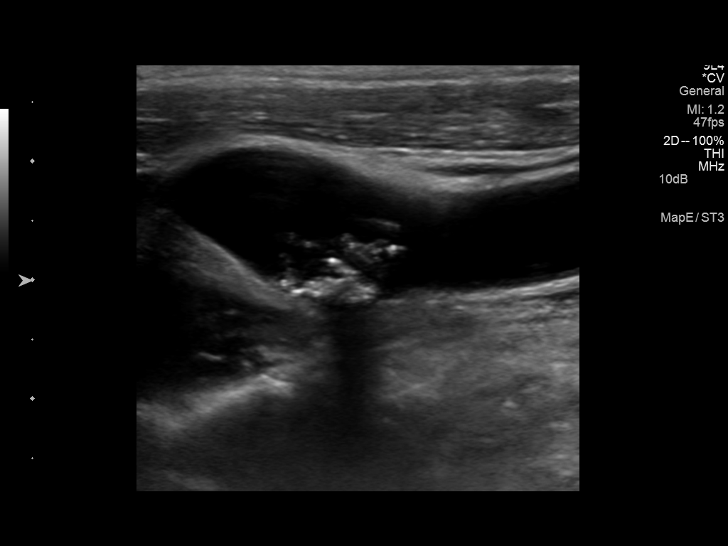
[im 86/105]
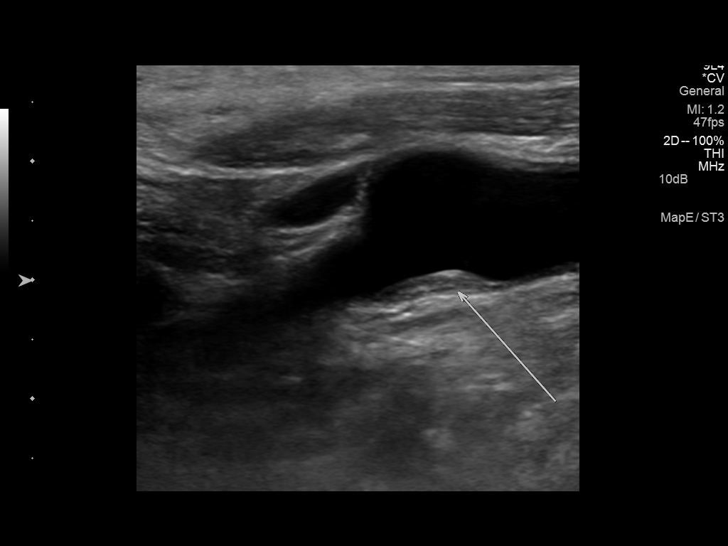
[im 95/105]
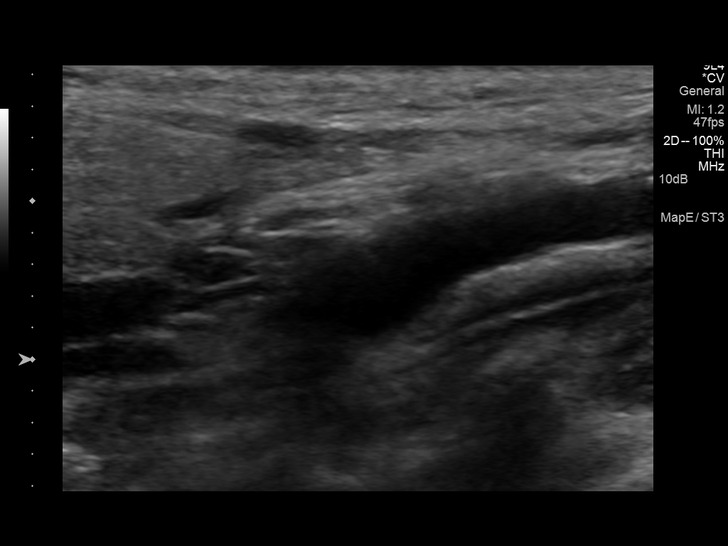
[im 105/105]
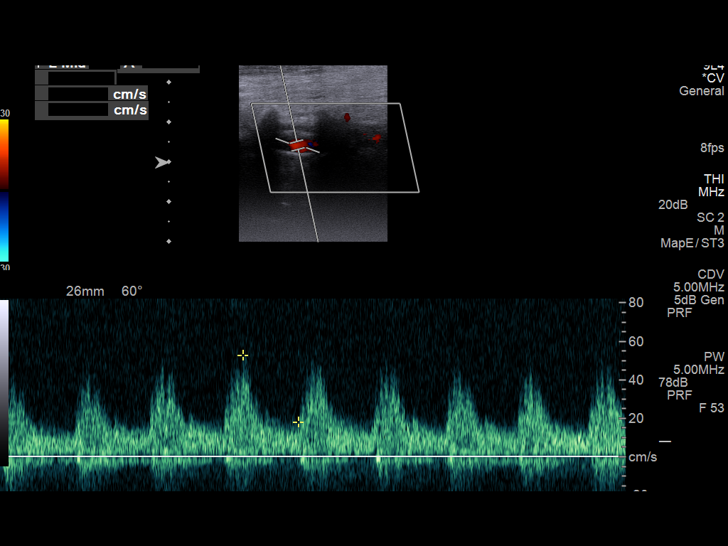

[13 of 24 positions shown; findings below may reference images not displayed]

FINDINGS: Criteria: Quantification of carotid stenosis is based on velocity
parameters that correlate the residual internal carotid diameter
with NASCET-based stenosis levels, using the diameter of the distal
internal carotid lumen as the denominator for stenosis measurement.

The following velocity measurements were obtained:

RIGHT

ICA:  86/34 cm/sec

CCA:  124/32 cm/sec

SYSTOLIC ICA/CCA RATIO:

DIASTOLIC ICA/CCA RATIO:

ECA:  100 cm/sec

LEFT

ICA:  101/40 cm/sec

CCA:  111/31 cm/sec

SYSTOLIC ICA/CCA RATIO:

DIASTOLIC ICA/CCA RATIO:

ECA:  101 cm/sec

RIGHT CAROTID ARTERY: Minor echogenic shadowing plaque formation. No
hemodynamically significant right ICA stenosis, velocity elevation,
or turbulent flow. Degree of narrowing less than 50%.

RIGHT VERTEBRAL ARTERY:  Antegrade

LEFT CAROTID ARTERY: Similar scattered minor echogenic plaque
formation. No hemodynamically significant left ICA stenosis,
velocity elevation, or turbulent flow.

LEFT VERTEBRAL ARTERY:  Antegrade
IMPRESSION: Minor carotid atherosclerosis. No hemodynamically significant ICA
stenosis by ultrasound.

## 2015-03-01 IMAGING — CR DG FOOT COMPLETE 3+V*R*
1 series · 3 of 3 positions shown · non-contrast
Comparison: 04/30/2014.

CLINICAL DATA: Wound infection. Patient had surgery on the foot
today. Has a defect in the plantar surface of the foot. Initial
encounter.

EXAM:
RIGHT FOOT COMPLETE - 3+ VIEW

[Series 1: ap · 0.17mm/px · 3 of 3 slices shown]
[im 1/3]
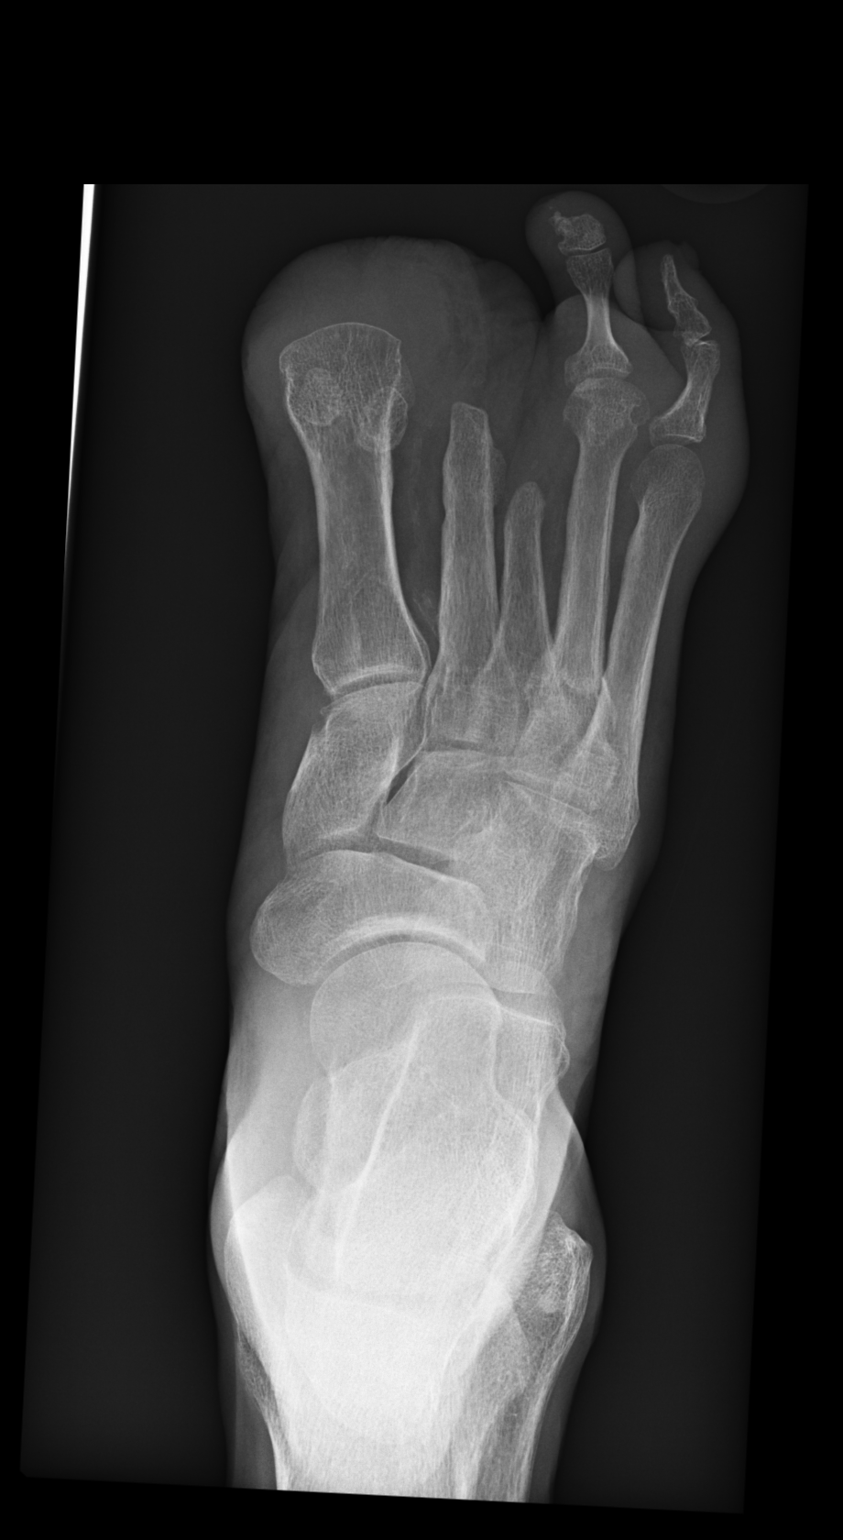
[im 2/3]
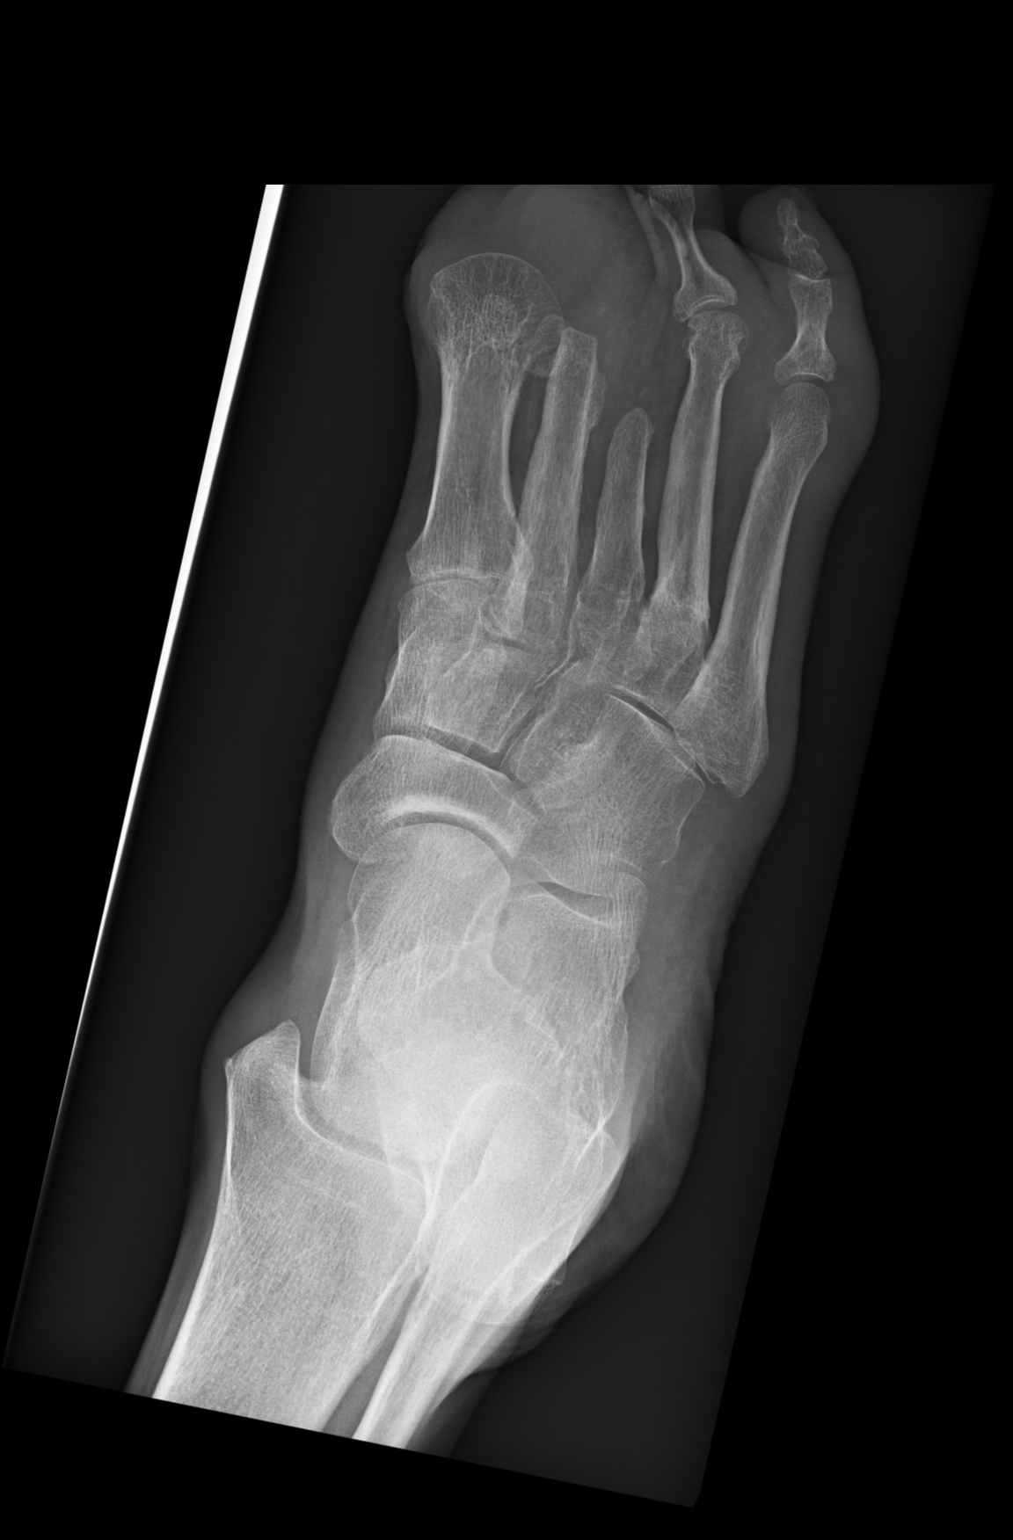
[im 3/3]
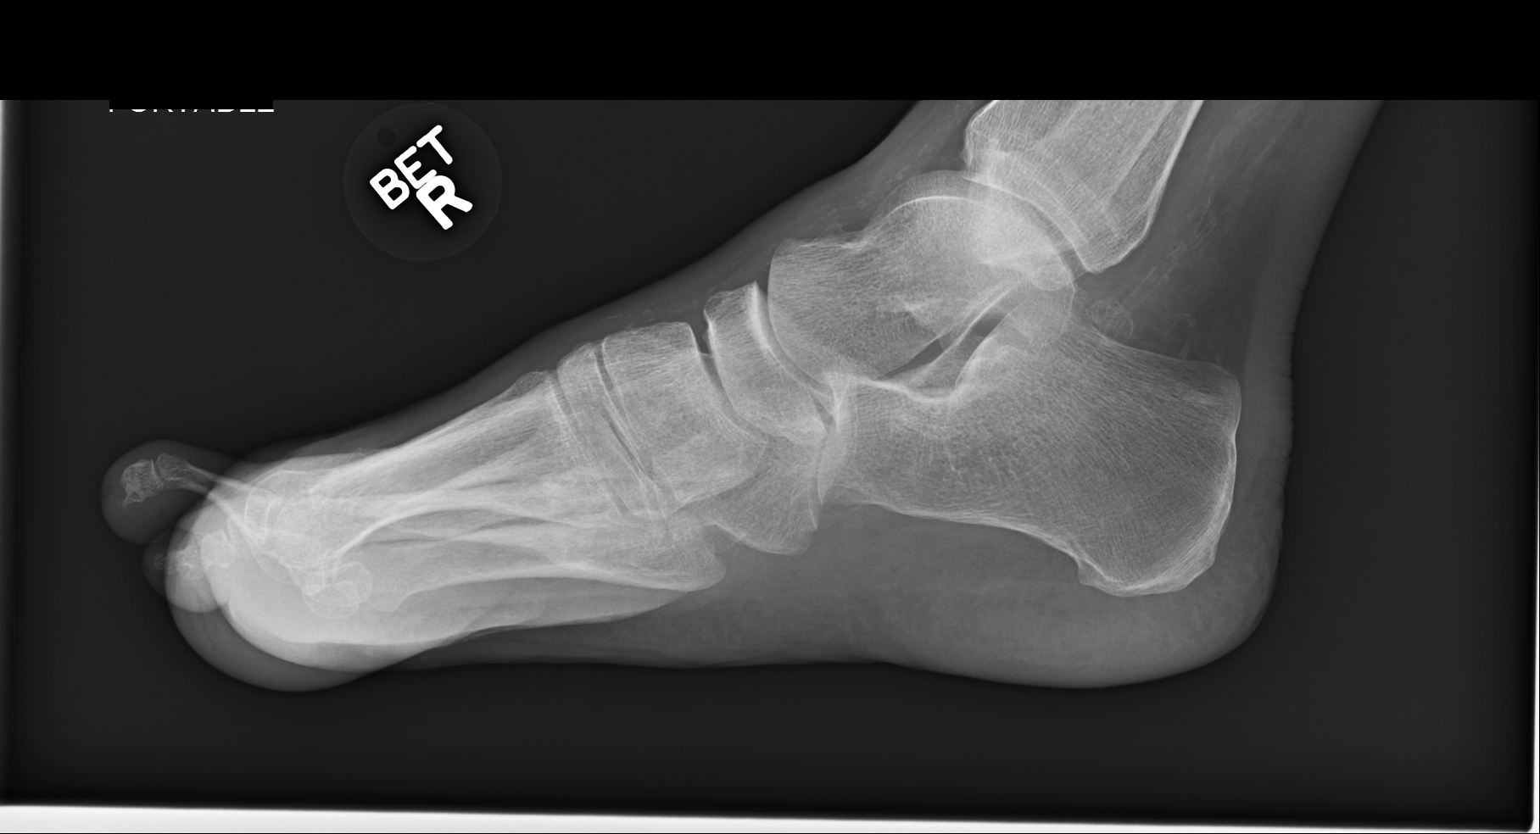

[3 of 3 positions shown; findings below may reference images not displayed]

FINDINGS: No acute fracture.  No dislocation.

Previous amputation of the right great toe. There has also been
previous amputations of the second and third toes including distal
portions of the second and third metatarsals. There is an old healed
fracture of the base of the fourth metatarsal. These findings are
stable.

There are no areas of bone resorption to suggest osteomyelitis.
There is no soft tissue air. Soft tissues demonstrate stable
vascular calcifications. No radiopaque foreign bodies.
IMPRESSION: 1. No fracture or acute finding.
2. No evidence of osteomyelitis.
3. No soft tissue air.
